# Patient Record
Sex: Female | Born: 1955 | Race: White | Hispanic: No | Marital: Married | Smoking: Current every day smoker
Health system: Southern US, Community
[De-identification: ages and names within clinical notes are randomized; demographics above are authoritative.]

## PROBLEM LIST (undated history)

## (undated) DIAGNOSIS — K219 Gastro-esophageal reflux disease without esophagitis: Secondary | ICD-10-CM

## (undated) DIAGNOSIS — J381 Polyp of vocal cord and larynx: Secondary | ICD-10-CM

## (undated) DIAGNOSIS — M810 Age-related osteoporosis without current pathological fracture: Secondary | ICD-10-CM

## (undated) DIAGNOSIS — C539 Malignant neoplasm of cervix uteri, unspecified: Secondary | ICD-10-CM

## (undated) HISTORY — DX: Gastro-esophageal reflux disease without esophagitis: K21.9

## (undated) HISTORY — DX: Age-related osteoporosis without current pathological fracture: M81.0

## (undated) HISTORY — PX: MOUTH SURGERY: SHX715

## (undated) HISTORY — DX: Malignant neoplasm of cervix uteri, unspecified: C53.9

## (undated) HISTORY — PX: ABDOMINAL HYSTERECTOMY: SHX81

## (undated) HISTORY — DX: Polyp of vocal cord and larynx: J38.1

---

## 1999-06-05 ENCOUNTER — Encounter: Payer: Self-pay | Admitting: Family Medicine

## 1999-06-05 ENCOUNTER — Ambulatory Visit (HOSPITAL_COMMUNITY): Admission: RE | Admit: 1999-06-05 | Discharge: 1999-06-05 | Payer: Self-pay | Admitting: Family Medicine

## 1999-06-18 ENCOUNTER — Encounter: Payer: Self-pay | Admitting: Family Medicine

## 1999-06-18 ENCOUNTER — Ambulatory Visit (HOSPITAL_COMMUNITY): Admission: RE | Admit: 1999-06-18 | Discharge: 1999-06-18 | Payer: Self-pay | Admitting: Family Medicine

## 1999-07-09 ENCOUNTER — Ambulatory Visit (HOSPITAL_COMMUNITY): Admission: RE | Admit: 1999-07-09 | Discharge: 1999-07-09 | Payer: Self-pay | Admitting: Gastroenterology

## 1999-07-09 ENCOUNTER — Encounter (INDEPENDENT_AMBULATORY_CARE_PROVIDER_SITE_OTHER): Payer: Self-pay

## 1999-08-07 ENCOUNTER — Ambulatory Visit (HOSPITAL_COMMUNITY): Admission: RE | Admit: 1999-08-07 | Discharge: 1999-08-07 | Payer: Self-pay | Admitting: Gastroenterology

## 1999-08-07 ENCOUNTER — Encounter: Payer: Self-pay | Admitting: Gastroenterology

## 2000-07-29 ENCOUNTER — Ambulatory Visit (HOSPITAL_COMMUNITY): Admission: RE | Admit: 2000-07-29 | Discharge: 2000-07-29 | Payer: Self-pay | Admitting: Family Medicine

## 2000-07-29 ENCOUNTER — Encounter: Payer: Self-pay | Admitting: Family Medicine

## 2000-12-03 ENCOUNTER — Encounter: Payer: Self-pay | Admitting: Family Medicine

## 2000-12-03 ENCOUNTER — Ambulatory Visit (HOSPITAL_COMMUNITY): Admission: RE | Admit: 2000-12-03 | Discharge: 2000-12-03 | Payer: Self-pay | Admitting: Family Medicine

## 2002-06-27 ENCOUNTER — Encounter: Payer: Self-pay | Admitting: Family Medicine

## 2002-06-27 ENCOUNTER — Ambulatory Visit (HOSPITAL_COMMUNITY): Admission: RE | Admit: 2002-06-27 | Discharge: 2002-06-27 | Payer: Self-pay | Admitting: Family Medicine

## 2002-07-14 ENCOUNTER — Ambulatory Visit (HOSPITAL_COMMUNITY): Admission: RE | Admit: 2002-07-14 | Discharge: 2002-07-14 | Payer: Self-pay | Admitting: Family Medicine

## 2002-07-14 ENCOUNTER — Encounter: Payer: Self-pay | Admitting: Family Medicine

## 2004-12-27 ENCOUNTER — Ambulatory Visit (HOSPITAL_COMMUNITY): Admission: RE | Admit: 2004-12-27 | Discharge: 2004-12-27 | Payer: Self-pay | Admitting: Family Medicine

## 2005-10-15 HISTORY — PX: COLONOSCOPY: SHX174

## 2005-11-03 ENCOUNTER — Ambulatory Visit (HOSPITAL_COMMUNITY): Admission: RE | Admit: 2005-11-03 | Discharge: 2005-11-03 | Payer: Self-pay | Admitting: Gastroenterology

## 2005-11-03 ENCOUNTER — Encounter (INDEPENDENT_AMBULATORY_CARE_PROVIDER_SITE_OTHER): Payer: Self-pay | Admitting: *Deleted

## 2005-11-03 ENCOUNTER — Ambulatory Visit: Payer: Self-pay | Admitting: Gastroenterology

## 2007-02-05 ENCOUNTER — Ambulatory Visit (HOSPITAL_COMMUNITY): Admission: RE | Admit: 2007-02-05 | Discharge: 2007-02-05 | Payer: Self-pay | Admitting: Family Medicine

## 2007-02-18 ENCOUNTER — Ambulatory Visit (HOSPITAL_COMMUNITY): Admission: RE | Admit: 2007-02-18 | Discharge: 2007-02-18 | Payer: Self-pay | Admitting: Family Medicine

## 2008-09-06 ENCOUNTER — Ambulatory Visit (HOSPITAL_COMMUNITY): Admission: RE | Admit: 2008-09-06 | Discharge: 2008-09-06 | Payer: Self-pay | Admitting: Family Medicine

## 2008-11-16 ENCOUNTER — Encounter (HOSPITAL_COMMUNITY): Admission: RE | Admit: 2008-11-16 | Discharge: 2008-12-13 | Payer: Self-pay | Admitting: Family Medicine

## 2008-11-16 ENCOUNTER — Ambulatory Visit (HOSPITAL_COMMUNITY): Payer: Self-pay | Admitting: Family Medicine

## 2009-09-10 ENCOUNTER — Ambulatory Visit (HOSPITAL_COMMUNITY): Admission: RE | Admit: 2009-09-10 | Discharge: 2009-09-10 | Payer: Self-pay | Admitting: Family Medicine

## 2010-04-07 ENCOUNTER — Encounter: Payer: Self-pay | Admitting: Family Medicine

## 2010-08-02 NOTE — Op Note (Signed)
NAME:  Brittany Blankenship, Brittany Blankenship                ACCOUNT NO.:  [id number]   MEDICAL RECORD NO.:  [id number]          PATIENT TYPE:  AMB   LOCATION:  DAY                           FACILITY:  [facility]   PHYSICIAN:  Kassie Mends, M.D.      DATE OF BIRTH:  1955/09/11   DATE OF PROCEDURE:  11/03/2005  DATE OF DISCHARGE:                                 OPERATIVE REPORT   REFERRING PHYSICIAN:  Scott A. Gerda Diss, M.D.   PROCEDURE:  Colonoscopy with cold forceps polypectomy.   INDICATIONS FOR PROCEDURE:  Brittany Blankenship is a 55 year old female who presents  for average-risk colon cancer screening.   FINDINGS:  1. 4 mm sessile rectal polyp removed via cold forceps.  2. Otherwise no inflammatory changes, vascular ectasias, or diverticula      evident.  3. Otherwise, normal retroflexed view of the rectum.   RECOMMENDATIONS:  1. Follow-up biopsies.  If biopsy reveals an adenomatous polyp, then she      need repeat screening colonoscopy in 5 years.  Her first degree      relatives would need to begin colon cancer screening at age 82.  2. No aspirin, NSAIDS, or anticoagulation for 5 days.  3. Follow up with Scott A. Gerda Diss, M.D.   MEDICATIONS:  1. Demerol 100 mg IV.  2. Versed 4 mg IV.   DESCRIPTION OF PROCEDURE:  Physical examination was performed and informed  consent was obtained from the patient after explaining all the benefits,  risks, and alternatives to the procedure.  The patient was connected to the  monitor and placed in the left lateral position.  Continuous oxygen was  provided by nasal cannula and IV medicine administered through an indwelling  cannula.  After administration of sedation and rectal examination, the scope  was  advanced under direct visualization to the cecum.  The scope was  subsequently removed slowly by carefully examining the color, texture,  anatomy, and integrity of the mucosa on the way out.  The patient was  recovered in the endoscopy suite and discharged to home in  satisfactory  condition.      Kassie Mends, M.D.  Electronically Signed     SM/MEDQ  D:  11/03/2005  T:  11/03/2005  Job:  [id number]   cc:   Lorin Picket A. Gerda Diss, MD  Fax: [phone]

## 2010-10-17 ENCOUNTER — Encounter: Payer: Self-pay | Admitting: Gastroenterology

## 2011-09-08 ENCOUNTER — Ambulatory Visit (HOSPITAL_COMMUNITY)
Admission: RE | Admit: 2011-09-08 | Discharge: 2011-09-08 | Disposition: A | Discharge status: Home / Self Care | Payer: BC Managed Care – PPO | Source: Ambulatory Visit | Attending: Family Medicine | Admitting: Family Medicine

## 2011-09-08 ENCOUNTER — Other Ambulatory Visit: Payer: Self-pay | Admitting: Family Medicine

## 2011-09-08 DIAGNOSIS — S92919A Unspecified fracture of unspecified toe(s), initial encounter for closed fracture: Secondary | ICD-10-CM | POA: Insufficient documentation

## 2011-09-08 DIAGNOSIS — X58XXXA Exposure to other specified factors, initial encounter: Secondary | ICD-10-CM | POA: Insufficient documentation

## 2011-09-08 DIAGNOSIS — M79672 Pain in left foot: Secondary | ICD-10-CM

## 2011-09-08 DIAGNOSIS — M79609 Pain in unspecified limb: Secondary | ICD-10-CM | POA: Insufficient documentation

## 2012-07-24 ENCOUNTER — Encounter: Payer: Self-pay | Admitting: *Deleted

## 2012-07-27 ENCOUNTER — Encounter: Payer: Self-pay | Admitting: Family Medicine

## 2012-07-27 ENCOUNTER — Ambulatory Visit (INDEPENDENT_AMBULATORY_CARE_PROVIDER_SITE_OTHER): Payer: BC Managed Care – PPO | Admitting: Family Medicine

## 2012-07-27 VITALS — BP 142/86 | Temp 98.5°F

## 2012-07-27 DIAGNOSIS — R1013 Epigastric pain: Secondary | ICD-10-CM

## 2012-07-27 DIAGNOSIS — R109 Unspecified abdominal pain: Secondary | ICD-10-CM

## 2012-07-27 MED ORDER — PANTOPRAZOLE SODIUM 40 MG PO TBEC: 40.0000 mg | DELAYED_RELEASE_TABLET | Freq: Every day | ORAL | Status: DC

## 2012-07-27 NOTE — Progress Notes (Signed)
Ct abd/pelvis with contrast scheduled [facility] 5-16 8 am pt notified

## 2012-07-27 NOTE — Patient Instructions (Signed)
It is important to go ahead and start taking your medication. One tablet every day this will reduce the amount of acid in your stomach and should help with the pain. It is okay to use Maalox up to 4 times a day. If you get worse over the next several days please call me. Please do your lab work I will let you know the results. We will also set you up for a CAT scan as well. I will also be working to set you up with Dr. Karilyn Cota.

## 2012-07-27 NOTE — Progress Notes (Signed)
  Subjective:    Patient ID: Brittany Blankenship, female    DOB: 1956/02/05, 57 y.o.   MRN: [id number]  Abdominal Pain The current episode started 1 to 4 weeks ago. The onset quality is gradual. The problem occurs constantly. Associated symptoms include anorexia, belching, flatus and nausea. The pain is aggravated by certain positions. Treatments tried: mylanta. The treatment provided no relief. Her past medical history is significant for GERD.  started off with tenderness, 2 weeks ago constsant. Worse with eating. At first diarrhea now dark bm. No dizziness. No sweats. Worse with laying down. Sleeping in a recliner. Patient relates that the pain is now constant causes such discomfort that it does wake her at night does not radiate down the legs she denies any chest pressure tightness denies reflux symptoms. She states that the pain is consistent but seems to be getting worse. She does smoke. She does not drink. She does not abuse and try an inflammatory his. Past medical history is benign. Family history benign. No weight loss recently. No sweats or chills.   Review of Systems  Gastrointestinal: Positive for nausea, abdominal pain, anorexia and flatus.       Objective:   Physical Exam Vital signs stable. Neck is supple no masses are felt lungs are clear no crackles heart is regular abdomen is with moderate 2 severe tenderness throughout the midabdomen area no guarding but she does have significant tenderness and pain in the epigastric midabdomen and left quadrant areas. This is much more than what should be with gastritis. Extremities no edema       Assessment & Plan:  Significant abdominal pain-this patient needs to go ahead and start on a PPI to cover for gastritis but I am very concerned about the possibility of other problems going on I would recommend CT scan of the abdomen to look at the pancreas as well as to do lab work in addition to this we need to go ahead and refer the patient to  gastroenterology they will more than likely need an EGD await the results of these tests. 25 minutes spent with patient [phone]

## 2012-07-28 LAB — CBC WITH DIFFERENTIAL/PLATELET
Eosinophils Absolute: 0.3 10*3/uL (ref 0.0–0.7)
Eosinophils Relative: 3 % (ref 0–5)
HCT: 39.7 % (ref 36.0–46.0)
Lymphocytes Relative: 42 % (ref 12–46)
Lymphs Abs: 4.1 10*3/uL — ABNORMAL HIGH (ref 0.7–4.0)
MCH: 29.7 pg (ref 26.0–34.0)
MCV: 88 fL (ref 78.0–100.0)
Monocytes Absolute: 0.6 10*3/uL (ref 0.1–1.0)
Monocytes Relative: 6 % (ref 3–12)
Platelets: 371 10*3/uL (ref 150–400)
RBC: 4.51 MIL/uL (ref 3.87–5.11)

## 2012-07-28 LAB — HEPATIC FUNCTION PANEL
ALT: 14 U/L (ref 0–35)
Albumin: 4.3 g/dL (ref 3.5–5.2)
Alkaline Phosphatase: 73 U/L (ref 39–117)
Total Bilirubin: 0.3 mg/dL (ref 0.3–1.2)
Total Protein: 6.6 g/dL (ref 6.0–8.3)

## 2012-07-28 LAB — BASIC METABOLIC PANEL
Chloride: 105 mEq/L (ref 96–112)
Creat: 0.69 mg/dL (ref 0.50–1.10)
Potassium: 4.1 mEq/L (ref 3.5–5.3)

## 2012-07-28 LAB — LIPASE: Lipase: 33 U/L (ref 0–75)

## 2012-07-30 ENCOUNTER — Ambulatory Visit (HOSPITAL_COMMUNITY)
Admission: RE | Admit: 2012-07-30 | Discharge: 2012-07-30 | Disposition: A | Discharge status: Home / Self Care | Payer: Federal, State, Local not specified - PPO | Source: Ambulatory Visit | Attending: Family Medicine | Admitting: Family Medicine

## 2012-07-30 DIAGNOSIS — R110 Nausea: Secondary | ICD-10-CM | POA: Insufficient documentation

## 2012-07-30 DIAGNOSIS — R109 Unspecified abdominal pain: Secondary | ICD-10-CM | POA: Insufficient documentation

## 2012-07-30 MED ORDER — IOHEXOL 300 MG/ML  SOLN
100.0000 mL | Freq: Once | INTRAMUSCULAR | Status: AC | PRN
  Administered 2012-07-30: 100 mL via INTRAVENOUS

## 2012-08-17 ENCOUNTER — Encounter: Payer: Self-pay | Admitting: Family Medicine

## 2012-08-17 ENCOUNTER — Ambulatory Visit (HOSPITAL_COMMUNITY)
Admission: RE | Admit: 2012-08-17 | Discharge: 2012-08-17 | Disposition: A | Discharge status: Home / Self Care | Payer: Federal, State, Local not specified - PPO | Source: Ambulatory Visit | Attending: Family Medicine | Admitting: Family Medicine

## 2012-08-17 ENCOUNTER — Ambulatory Visit (INDEPENDENT_AMBULATORY_CARE_PROVIDER_SITE_OTHER): Payer: BC Managed Care – PPO | Admitting: Family Medicine

## 2012-08-17 VITALS — BP 130/86 | Temp 98.7°F | Ht 66.0 in | Wt 125.1 lb

## 2012-08-17 DIAGNOSIS — M25579 Pain in unspecified ankle and joints of unspecified foot: Secondary | ICD-10-CM | POA: Insufficient documentation

## 2012-08-17 DIAGNOSIS — S99929A Unspecified injury of unspecified foot, initial encounter: Secondary | ICD-10-CM

## 2012-08-17 DIAGNOSIS — S99922A Unspecified injury of left foot, initial encounter: Secondary | ICD-10-CM

## 2012-08-17 DIAGNOSIS — M7989 Other specified soft tissue disorders: Secondary | ICD-10-CM | POA: Insufficient documentation

## 2012-08-17 DIAGNOSIS — M79609 Pain in unspecified limb: Secondary | ICD-10-CM | POA: Insufficient documentation

## 2012-08-17 DIAGNOSIS — S8990XA Unspecified injury of unspecified lower leg, initial encounter: Secondary | ICD-10-CM

## 2012-08-17 NOTE — Patient Instructions (Signed)
Do your xrays

## 2012-08-17 NOTE — Progress Notes (Signed)
  Subjective:    Patient ID: Brittany Blankenship, female    DOB: 01-Nov-1955, 57 y.o.   MRN: [id number]  Ankle Pain  The incident occurred more than 1 week ago. The incident occurred at home. The injury mechanism was a fall. The pain is present in the left foot, left ankle and left leg. The quality of the pain is described as aching. The pain is at a severity of 7/10. The pain is moderate. The pain has been constant since onset. Associated symptoms include an inability to bear weight. She reports no foreign bodies present. The symptoms are aggravated by weight bearing. She has tried non-weight bearing for the symptoms. The treatment provided moderate relief.   She relates that she was stepping down off a ladder and she felt something give in her ankle but she denies a pop it did not swell right away but as the day went on it started swelling causing pain and discomfort became worse over the next several days now she is to the point where she is limping. She is a smoker age 4. Past medical history benign no fevers. Family history noncontributory.   Review of SystemsCalf is nontender knee is normal ankle is swollen foot is swollen with moderate edema     Objective:   Physical Exam Patient with significant swelling in the ankle and footNo cellulitis. Moderate tenderness on palpation in the medial aspect as well as central aspect of the lower leg plus tenderness in the foot.       Assessment & Plan:  Ankle sprain-cannot rule out the possibility of a stress fracture. X-rays of the lower leg as well as foot recommended. Plus also depending on how patient does over the next couple weeks mainly in that needing to have bone scan or MRI. Patient understands this.

## 2013-01-06 ENCOUNTER — Telehealth: Payer: Self-pay | Admitting: Family Medicine

## 2013-01-06 NOTE — Telephone Encounter (Signed)
I certainly support people being able to see their specialist. But let's be clear if this requires a referral in we have not seen them very recently for this issue then [facility] says we see them evaluate them then referred them if indicated.

## 2013-01-06 NOTE — Telephone Encounter (Signed)
Pt needs new referral to Dr. Karilyn Cota for an upper GI series, per pt's husband-previous referral is too old, needs new one.  Please initiate in system so that I may process

## 2013-01-07 NOTE — Telephone Encounter (Signed)
Per Dewayne Hatch - If we havent seen the patient within last 3 years, we have them see their PCP as some issues may not be GI related.   Mr Brittany Blankenship was seen in 2012 so Im not sure why he was told to get a referral. I dont have any info for Brittany Blankenship so she would have been told to see her PCP to be evaluated and if her issues are GI related then her PCP can refer her.   Hope this helps

## 2013-01-07 NOTE — Telephone Encounter (Signed)
Emailed Ann @ Dr. Patty Sermons office for clarification, await response

## 2013-01-13 ENCOUNTER — Encounter: Payer: Self-pay | Admitting: Family Medicine

## 2013-01-13 ENCOUNTER — Ambulatory Visit (INDEPENDENT_AMBULATORY_CARE_PROVIDER_SITE_OTHER): Payer: BC Managed Care – PPO | Admitting: Family Medicine

## 2013-01-13 VITALS — BP 124/82 | Temp 98.6°F | Ht 66.0 in | Wt 131.4 lb

## 2013-01-13 DIAGNOSIS — R109 Unspecified abdominal pain: Secondary | ICD-10-CM

## 2013-01-13 DIAGNOSIS — M25579 Pain in unspecified ankle and joints of unspecified foot: Secondary | ICD-10-CM

## 2013-01-13 DIAGNOSIS — M25571 Pain in right ankle and joints of right foot: Secondary | ICD-10-CM

## 2013-01-13 NOTE — Progress Notes (Signed)
  Subjective:    Patient ID: Brittany Blankenship, female    DOB: 05/30/1955, 57 y.o.   MRN: [id number]  Abdominal Pain This is a recurrent problem. The current episode started more than 1 month ago. The onset quality is undetermined. The problem occurs intermittently. The problem has been unchanged. The pain is located in the generalized abdominal region. The pain is mild. The quality of the pain is aching and dull (numbness). The abdominal pain radiates to the epigastric region. The pain is aggravated by eating. The pain is relieved by nothing. She has tried proton pump inhibitors for the symptoms. The treatment provided no relief.   she also has foot pain has seen a podiatrist they recommended cutting I nerve she would like a second opinion She smokes she knows she needs quit Patient had CT scan of abdomen which was benign but never followed through at doing at EGD.    Review of Systems  Gastrointestinal: Positive for abdominal pain.   right foot pain denies chest tightness pressure    Objective:   Physical Exam  Lungs are clear hearts regular pulse normal moderate epigastric abdominal pain and discomfort. Extremities no edema skin warm foot exam she has tenderness in the lower part of the foot near the second and third metatarsal. She is RE seen a podiatrist who recommended cutting some nerves in her foot she did not want to do that she desires a second opinion      Assessment & Plan:  Foot -orth ref Abd- [facility]- EGD, urgent referral. Hopefully they can EGD in the near future. Warning signs discussed

## 2013-01-14 ENCOUNTER — Telehealth: Payer: Self-pay | Admitting: *Deleted

## 2013-01-14 NOTE — Telephone Encounter (Signed)
Called to tell patient that Dr. Patty Sermons office stated she was Dr. Darrick Penna patient and they would not see her. Patient stated she did not know who Dr. Darrick Penna was and that Dr. Karilyn Cota did her colonoscopy. She stated if [facility] would not see her then she wanted to go to [facility].

## 2013-01-17 ENCOUNTER — Other Ambulatory Visit: Payer: Self-pay | Admitting: Family Medicine

## 2013-01-17 DIAGNOSIS — K219 Gastro-esophageal reflux disease without esophagitis: Secondary | ICD-10-CM

## 2013-01-17 NOTE — Telephone Encounter (Signed)
Referral to GI, put in , our staff will set up appt and then pt will be notified.

## 2013-01-20 ENCOUNTER — Encounter (INDEPENDENT_AMBULATORY_CARE_PROVIDER_SITE_OTHER): Payer: Self-pay | Admitting: *Deleted

## 2013-01-25 ENCOUNTER — Ambulatory Visit (INDEPENDENT_AMBULATORY_CARE_PROVIDER_SITE_OTHER): Payer: Federal, State, Local not specified - PPO | Admitting: Internal Medicine

## 2013-01-25 ENCOUNTER — Encounter (INDEPENDENT_AMBULATORY_CARE_PROVIDER_SITE_OTHER): Payer: Self-pay | Admitting: *Deleted

## 2013-01-25 ENCOUNTER — Encounter (INDEPENDENT_AMBULATORY_CARE_PROVIDER_SITE_OTHER): Payer: Self-pay | Admitting: Internal Medicine

## 2013-01-25 ENCOUNTER — Other Ambulatory Visit (INDEPENDENT_AMBULATORY_CARE_PROVIDER_SITE_OTHER): Payer: Self-pay | Admitting: *Deleted

## 2013-01-25 VITALS — BP 142/76 | HR 72 | Temp 98.6°F | Ht 66.0 in | Wt 130.8 lb

## 2013-01-25 DIAGNOSIS — K219 Gastro-esophageal reflux disease without esophagitis: Secondary | ICD-10-CM

## 2013-01-25 DIAGNOSIS — G8929 Other chronic pain: Secondary | ICD-10-CM

## 2013-01-25 DIAGNOSIS — M810 Age-related osteoporosis without current pathological fracture: Secondary | ICD-10-CM

## 2013-01-25 DIAGNOSIS — R1013 Epigastric pain: Secondary | ICD-10-CM

## 2013-01-25 NOTE — Progress Notes (Signed)
Subjective:     Patient ID: Brittany Blankenship, female   DOB: [birth year], 57 y.o.   MRN: [id number]  HPI Referred to our office by Dr. Gerda Diss for epigastric pain. She has had pain in her epigastric pain for about 6- 7 months. She describes a tenderness. The tenderness is constant.  She saw Dr. Gerda Diss, and was placed on Protonix. The protonix has helped. Before the Protonix she would ball at night and would rest her stomach on a blanket. It hurt to lie on her abdomen.   She was having bouts of diarrhea, but is better with the Protonix. She has a fullness in her epigastric region.  She denies any acid reflux. No dysphagia. Appetite is not good. No weight loss. Foods do not taste good. She has early satiety. She usually has a BM x 2 a day.    Hx of pancreatic cancer: Mother   07/2012 CT abdomen/pelvis with CM;  IMPRESSION:  1. No acute findings or significant abdominal or pelvic pathology  noted.  2. Incidentally noted bilateral L5 pars defects, with severe  degenerative disc disease and grade 1 anterolisthesis at L5-S1.   Colonoscopy 2007 DR. Fields: 1. 4 mm sessile rectal polyp removed via cold forceps.  2. Otherwise no inflammatory changes, vascular ectasias, or diverticula  evident.  3. Otherwise, normal retroflexed view of the rectum. Biopsy:   RECTUM, BIOPSY: HYPERPLASTIC POLYP. NO ADENOMATOUS CHANGE OR MALIGNANCY IDENTIFIED.   Family HX Married One daughter that has a hx of a brain tumor. She works at the [employer] Value Date/Time   WBC 9.7 07/27/2012 1550   RBC 4.51 07/27/2012 1550   HGB 13.4 07/27/2012 1550   HCT 39.7 07/27/2012 1550   PLT 371 07/27/2012 1550   MCV 88.0 07/27/2012 1550   MCH 29.7 07/27/2012 1550   MCHC 33.8 07/27/2012 1550   RDW 13.2 07/27/2012 1550   LYMPHSABS 4.1* 07/27/2012 1550   MONOABS 0.6 07/27/2012 1550   EOSABS 0.3 07/27/2012 1550   BASOSABS 0.1 07/27/2012 1550    Hepatic Function Panel     Component Value Date/Time   PROT  6.6 07/27/2012 1550   ALBUMIN 4.3 07/27/2012 1550   AST 19 07/27/2012 1550   ALT 14 07/27/2012 1550   ALKPHOS 73 07/27/2012 1550   BILITOT 0.3 07/27/2012 1550   BILIDIR <0.1 07/27/2012 1550   IBILI NOT CALC 07/27/2012 1550      Review of Systems see hpi Current Outpatient Prescriptions  Medication Sig Dispense Refill  . pantoprazole (PROTONIX) 40 MG tablet Take 40 mg by mouth daily. PRN       No current facility-administered medications for this visit.   Past Surgical History  Procedure Laterality Date  . Mouth surgery    . Colonoscopy  10/2005    polyps  . Abdominal hysterectomy      partial   Allergies  Allergen Reactions  . Levaquin [Levofloxacin In D5w]     Joint pain  . Voltaren [Diclofenac Sodium]     Edema to face        Objective:   Physical Exam  Filed Vitals:   01/25/13 1057  BP: 142/76  Pulse: 72  Temp: 98.6 F (37 C)  Height: 5\' 6"  (1.676 m)  Weight: 130 lb 12.8 oz (59.33 kg)   Alert and oriented. Skin warm and dry. Oral mucosa is moist.   . Sclera anicteric, conjunctivae is pink. Thyroid not enlarged. No cervical lymphadenopathy. Lungs clear.  Heart regular rate and rhythm.  Abdomen is soft. Bowel sounds are positive. No hepatomegaly. No abdominal masses felt. No tenderness.  No edema to lower extremities.       Assessment:   Epigastric tenderness x 7 months.     Plan:    EGD with Dr Karilyn Cota. The risks and benefits such as perforation, bleeding, and infection were reviewed with the patient and is agreeable.

## 2013-01-25 NOTE — Patient Instructions (Signed)
EGD with Dr. Rehman. 

## 2013-01-26 DIAGNOSIS — R1013 Epigastric pain: Secondary | ICD-10-CM | POA: Insufficient documentation

## 2013-01-26 DIAGNOSIS — K219 Gastro-esophageal reflux disease without esophagitis: Secondary | ICD-10-CM | POA: Insufficient documentation

## 2013-01-26 DIAGNOSIS — M810 Age-related osteoporosis without current pathological fracture: Secondary | ICD-10-CM | POA: Insufficient documentation

## 2013-01-31 ENCOUNTER — Encounter (HOSPITAL_COMMUNITY): Payer: Self-pay | Admitting: Pharmacy Technician

## 2013-02-03 ENCOUNTER — Encounter (HOSPITAL_COMMUNITY)
Admission: RE | Disposition: A | Discharge status: Home / Self Care | Payer: Self-pay | Source: Ambulatory Visit | Attending: Internal Medicine

## 2013-02-03 ENCOUNTER — Ambulatory Visit (HOSPITAL_COMMUNITY)
Admission: RE | Admit: 2013-02-03 | Discharge: 2013-02-03 | Disposition: A | Discharge status: Home / Self Care | Payer: Federal, State, Local not specified - PPO | Source: Ambulatory Visit | Attending: Internal Medicine | Admitting: Internal Medicine

## 2013-02-03 ENCOUNTER — Encounter (HOSPITAL_COMMUNITY): Payer: Self-pay

## 2013-02-03 DIAGNOSIS — R1013 Epigastric pain: Secondary | ICD-10-CM | POA: Insufficient documentation

## 2013-02-03 DIAGNOSIS — K2960 Other gastritis without bleeding: Secondary | ICD-10-CM

## 2013-02-03 DIAGNOSIS — G8929 Other chronic pain: Secondary | ICD-10-CM

## 2013-02-03 HISTORY — PX: ESOPHAGOGASTRODUODENOSCOPY: SHX5428

## 2013-02-03 SURGERY — EGD (ESOPHAGOGASTRODUODENOSCOPY)
Anesthesia: Moderate Sedation

## 2013-02-03 MED ORDER — SUCRALFATE 1 G PO TABS: 1.0000 g | ORAL_TABLET | Freq: Three times a day (TID) | ORAL | Status: DC

## 2013-02-03 MED ORDER — MIDAZOLAM HCL 5 MG/5ML IJ SOLN
INTRAMUSCULAR | Status: AC
  Filled 2013-02-03: qty 10

## 2013-02-03 MED ORDER — MIDAZOLAM HCL 5 MG/5ML IJ SOLN
INTRAMUSCULAR | Status: DC | PRN
  Administered 2013-02-03 (×3): 2 mg via INTRAVENOUS

## 2013-02-03 MED ORDER — MEPERIDINE HCL 50 MG/ML IJ SOLN
INTRAMUSCULAR | Status: DC | PRN
  Administered 2013-02-03 (×2): 25 mg via INTRAVENOUS

## 2013-02-03 MED ORDER — STERILE WATER FOR IRRIGATION IR SOLN
Status: DC | PRN
  Administered 2013-02-03: 16:00:00

## 2013-02-03 MED ORDER — SODIUM CHLORIDE 0.9 % IV SOLN
INTRAVENOUS | Status: DC
  Administered 2013-02-03: 15:00:00 via INTRAVENOUS

## 2013-02-03 MED ORDER — MEPERIDINE HCL 50 MG/ML IJ SOLN
INTRAMUSCULAR | Status: AC
  Filled 2013-02-03: qty 1

## 2013-02-03 NOTE — Op Note (Signed)
EGD PROCEDURE REPORT  PATIENT:  Brittany Blankenship  MR#:  [id number] Birthdate:  07-26-1955, 57 y.o., female Endoscopist:  Dr. Malissa Hippo, MD Referred By:  Dr. Lilyan Punt, MD Procedure Date: 02/03/2013  Procedure:   EGD  Indications:  Patient is 57 year old Caucasian female percent epigastric pain of over 7 months duration and negative workup including CT, CBC and LFTs. She's only partially responded to PPI. She is undergoing diagnostic EGD. Patient does take Aleve but no more than 6-8 doses in any given week.             Informed Consent:  The risks, benefits, alternatives & imponderables which include, but are not limited to, bleeding, infection, perforation, drug reaction and potential missed lesion have been reviewed.  The potential for biopsy, lesion removal, esophageal dilation, etc. have also been discussed.  Questions have been answered.  All parties agreeable.  Please see history & physical in medical record for more information.  Medications:  Demerol 50 mg IV Versed 6 mg IV Cetacaine spray topically for oropharyngeal anesthesia  Description of procedure:  The endoscope was introduced through the mouth and advanced to the second portion of the duodenum without difficulty or limitations. The mucosal surfaces were surveyed very carefully during advancement of the scope and upon withdrawal.  Findings:  Esophagus:  Normal mucosa of the esophagus. GE junction was unremarkable. GEJ:  38 cm Stomach:  Stomach was empty and distended very well with insufflation. Folds in the proximal stomach are normal. Examination mucosa gastric body was normal. There was patchy erythema and edema to antral mucosa with no erosions or ulcers were noted. Pyloric channel was patent. Angularis fundus and cardia were examined by retroflexion of scope and were normal. Duodenum:  Bulbar and postbulbar mucosa was normal.  Therapeutic/Diagnostic Maneuvers Performed:  Random biopsies taken from mucosa antrum  for routine histology.  Complications:  None  Impression: Nonerosive antral gastritis otherwise normal EGD. Gastric biopsy taken for routine histology.  Source of patient's symptoms remain unclear.  Recommendations:  Continue pantoprazole as before. Sucralfate 1 g by mouth a.c. and each bedtime. Patient advised to keep  symptom diary daily for the next two weeks.  Yazmyn Valbuena U  02/03/2013  4:13 PM  CC: Dr. Lilyan Punt, MD & Dr. Bonnetta Barry ref. provider found

## 2013-02-03 NOTE — H&P (Signed)
Brittany Blankenship is an 57 y.o. female.   Chief Complaint: Patient is here for EGD. HPI: Patient is 57 year old Caucasian female who presents with a were seven-month history of intermittent epigastric pain. She had abdominopelvic CT in May 2014 and no abnormality was noted: For symptoms. Her CBC and LFTs have been normal. She is 50% better but pantoprazole has been almost daily. She's not sure if pain gets better or worse with food. She denies nausea vomiting melena or rectal bleeding. Her appetite is fair. She has not lost any weight recently. She is up-to-date on screening for CRC.  Past Medical History  Diagnosis Date  . GERD (gastroesophageal reflux disease)   . Osteoporosis   . Vocal cord polyps     Past Surgical History  Procedure Laterality Date  . Mouth surgery    . Colonoscopy  10/2005    polyps  . Abdominal hysterectomy      partial    Family History  Problem Relation Age of Onset  . Diabetes Brother    Social History:  reports that she has been smoking.  She does not have any smokeless tobacco history on file. She reports that she does not drink alcohol or use illicit drugs.  Allergies:  Allergies  Allergen Reactions  . Levaquin [Levofloxacin In D5w]     Joint pain  . Voltaren [Diclofenac Sodium]     Edema to face    Medications Prior to Admission  Medication Sig Dispense Refill  . pantoprazole (PROTONIX) 40 MG tablet Take 40 mg by mouth daily as needed. Acid reflux        No results found for this or any previous visit (from the past 48 hour(s)). No results found.  ROS  Blood pressure 134/80, pulse 79, temperature 98.4 F (36.9 C), temperature source Oral, resp. rate 19, height 5\' 6"  (1.676 m), weight 130 lb (58.968 kg), SpO2 96.00%. Physical Exam  Constitutional: She is oriented to person, place, and time. She appears well-developed and well-nourished.  HENT:  Mouth/Throat: Oropharynx is clear and moist.  Eyes: Conjunctivae are normal. No scleral icterus.   Neck: No thyromegaly present.  Cardiovascular: Normal rate, regular rhythm and normal heart sounds.   No murmur heard. Respiratory: Effort normal and breath sounds normal.  GI: Soft. She exhibits no mass. Tenderness: mild midepigastric tenderness.  Musculoskeletal: She exhibits no edema.  Lymphadenopathy:    She has no cervical adenopathy.  Neurological: She is alert and oriented to person, place, and time.  Skin: Skin is warm and dry.     Assessment/Plan Chronic epigastric pain partially responsive to PPI. Diagnostic EGD.  Mindy Gali U 02/03/2013, 3:40 PM

## 2013-02-09 ENCOUNTER — Encounter (HOSPITAL_COMMUNITY): Payer: Self-pay | Admitting: Internal Medicine

## 2013-02-16 NOTE — Progress Notes (Signed)
LM with husband for patient to return the call.  

## 2013-02-16 NOTE — Progress Notes (Signed)
Apt has been scheduled for 05/16/12 with Dr. Karilyn Cota

## 2013-04-06 ENCOUNTER — Ambulatory Visit (INDEPENDENT_AMBULATORY_CARE_PROVIDER_SITE_OTHER): Payer: BC Managed Care – PPO | Admitting: Family Medicine

## 2013-04-06 ENCOUNTER — Encounter: Payer: Self-pay | Admitting: Family Medicine

## 2013-04-06 ENCOUNTER — Telehealth: Payer: Self-pay | Admitting: *Deleted

## 2013-04-06 VITALS — BP 148/92 | Temp 98.3°F | Ht 66.0 in | Wt 133.4 lb

## 2013-04-06 DIAGNOSIS — L2089 Other atopic dermatitis: Secondary | ICD-10-CM

## 2013-04-06 DIAGNOSIS — R109 Unspecified abdominal pain: Secondary | ICD-10-CM

## 2013-04-06 DIAGNOSIS — L209 Atopic dermatitis, unspecified: Secondary | ICD-10-CM

## 2013-04-06 MED ORDER — MOMETASONE FUROATE 0.1 % EX CREA: 1.0000 "application " | TOPICAL_CREAM | Freq: Two times a day (BID) | CUTANEOUS | Status: DC

## 2013-04-06 MED ORDER — PANTOPRAZOLE SODIUM 40 MG PO TBEC: 40.0000 mg | DELAYED_RELEASE_TABLET | Freq: Two times a day (BID) | ORAL | Status: DC

## 2013-04-06 MED ORDER — SUCRALFATE 1 G PO TABS: 1.0000 g | ORAL_TABLET | Freq: Three times a day (TID) | ORAL | Status: DC

## 2013-04-06 NOTE — Telephone Encounter (Signed)
Left message with nuclear med at [facility]. Need to schedule pt HIDA scan. Order is in. Pt cannot do Mondays. She wants early morning appt. Please call pt at [phone] when appt info.

## 2013-04-06 NOTE — Progress Notes (Signed)
   Subjective:    Patient ID: Brittany Blankenship, female    DOB: 1955/12/21, 58 y.o.   MRN: [id number]  HPI Patient is here today for abdominal pain and rash.  The abdominal pain is on going. This is nothing new. Please see below. She relates the abdominal discomfort or burning discomfort in the midabdomen. Epigastric married. Does not seem to have very with eating. Medications really have not helped any. He's had ongoing diarrhea that started well before she started having abdominal from she saw the gastroenterologist they did testing all of these notes were reviewed.  The rash is new. It is on her back. She first noticed it a week ago. It is itchy as well. She's not had this happen anywhere else it's not happened on her chest or her abdomen just on her back. Itches intensely.  She denies fever.    Review of Systems Patient relates epigastric pain discomfort relates slight nausea denies any vomiting. States the pain is there throughout the day does not wake her up at night had EGD   no cough no weight loss appetite good energy level fair. Objective:   Physical Exam  Lungs are clear hearts regular abdomen soft no guarding rebound or tenderness All of her gastroenterology records were reviewed in detail including pathology EGD notes and office notes     Assessment & Plan:  #1 rash-possible atopic dermatitis I find no evidence of any type of shingles. Elocon cream as directed twice daily.  #2 gastritis-increased Scientist, research (medical) twice daily. Continue Carafate. We will check some lab work because of the diarrhea to rule out celiac and check a CBC to make sure she does not get anemic also patient needs HIDA test. Await findings of this. Followup again in 3-4 weeks

## 2013-04-07 LAB — CBC WITH DIFFERENTIAL/PLATELET
Basophils Absolute: 0.1 10*3/uL (ref 0.0–0.1)
Basophils Relative: 1 % (ref 0–1)
EOS ABS: 0.3 10*3/uL (ref 0.0–0.7)
EOS PCT: 3 % (ref 0–5)
HCT: 40.2 % (ref 36.0–46.0)
Hemoglobin: 13.8 g/dL (ref 12.0–15.0)
LYMPHS ABS: 4.1 10*3/uL — AB (ref 0.7–4.0)
LYMPHS PCT: 41 % (ref 12–46)
MCH: 30.5 pg (ref 26.0–34.0)
MCHC: 34.3 g/dL (ref 30.0–36.0)
MCV: 88.7 fL (ref 78.0–100.0)
MONOS PCT: 6 % (ref 3–12)
Monocytes Absolute: 0.6 10*3/uL (ref 0.1–1.0)
Neutro Abs: 4.9 10*3/uL (ref 1.7–7.7)
Neutrophils Relative %: 49 % (ref 43–77)
Platelets: 315 10*3/uL (ref 150–400)
RBC: 4.53 MIL/uL (ref 3.87–5.11)
RDW: 12.9 % (ref 11.5–15.5)
WBC: 10 10*3/uL (ref 4.0–10.5)

## 2013-04-07 LAB — LIPASE: Lipase: 40 U/L (ref 0–75)

## 2013-04-07 LAB — TISSUE TRANSGLUTAMINASE, IGA: Tissue Transglutaminase Ab, IgA: 3.4 U/mL (ref <20)

## 2013-04-07 NOTE — Telephone Encounter (Signed)
Left message to return call 

## 2013-04-07 NOTE — Telephone Encounter (Signed)
Notified patient HIDA Scan scheduled at [facility] Tues 04/12/13 at 8 am- be there at 7:45am to register- NPO and No pain meds after midnight. Patient verbalized understanding.

## 2013-04-07 NOTE — Telephone Encounter (Signed)
HIDA Scan scheduled at [facility] Tues 04/12/13 at 8 am- be there at 7:45am to register- NPO and No pain meds after midnight.

## 2013-04-08 ENCOUNTER — Encounter: Payer: Self-pay | Admitting: Family Medicine

## 2013-04-12 ENCOUNTER — Encounter (HOSPITAL_COMMUNITY)
Admission: RE | Admit: 2013-04-12 | Discharge: 2013-04-12 | Disposition: A | Discharge status: Home / Self Care | Payer: Federal, State, Local not specified - PPO | Source: Ambulatory Visit | Attending: Family Medicine | Admitting: Family Medicine

## 2013-04-12 ENCOUNTER — Encounter (HOSPITAL_COMMUNITY): Payer: Self-pay

## 2013-04-12 DIAGNOSIS — R109 Unspecified abdominal pain: Secondary | ICD-10-CM | POA: Insufficient documentation

## 2013-04-12 MED ORDER — SINCALIDE 5 MCG IJ SOLR
INTRAMUSCULAR | Status: AC
  Filled 2013-04-12: qty 5

## 2013-04-12 MED ORDER — STERILE WATER FOR INJECTION IJ SOLN
INTRAMUSCULAR | Status: AC
Start: 2013-04-12 — End: 2013-04-12
  Filled 2013-04-12: qty 10

## 2013-04-12 MED ORDER — TECHNETIUM TC 99M MEBROFENIN IV KIT
5.0000 | PACK | Freq: Once | INTRAVENOUS | Status: AC | PRN
  Administered 2013-04-12: 5 via INTRAVENOUS

## 2013-04-29 ENCOUNTER — Telehealth: Payer: Self-pay | Admitting: Family Medicine

## 2013-04-29 NOTE — Telephone Encounter (Signed)
Rx prior auth obtained for pt's PANTOPRAZOLE 40mg  BID, auth dates 02/26/13-04/29/14, faxed approval to [facility]

## 2013-05-04 ENCOUNTER — Encounter: Payer: Self-pay | Admitting: Family Medicine

## 2013-05-04 ENCOUNTER — Ambulatory Visit (INDEPENDENT_AMBULATORY_CARE_PROVIDER_SITE_OTHER): Payer: BC Managed Care – PPO | Admitting: Family Medicine

## 2013-05-04 VITALS — BP 120/90 | Ht 66.0 in | Wt 134.1 lb

## 2013-05-04 DIAGNOSIS — R1013 Epigastric pain: Secondary | ICD-10-CM

## 2013-05-04 NOTE — Progress Notes (Signed)
   Subjective:    Patient ID: Brittany Blankenship, female    DOB: Jul 21, 1955, 59 y.o.   MRN: [id number]  HPI Patient is here for follow up visit for abdominal pain. Currently taking protonix and carafate for her symptoms. Has given her mild relief.  This patient is taking Protonix twice daily. Also using Carafate 2-4 times a day. She still smokes. She uses Aleve intermittently. She states she is trying eat healthier. She denies vomiting relates chronic epigastric pain no weight loss does not wake her up at night no rectal bleeding. Patient has had an endoscopy. She is up-to-date on colonoscopy. In addition to this she had a CT scan last May that did not show any tumors. Patient denies pelvic discomfort. She has had a hysterectomy but still has her ovaries. She also relates she still smokes and she knows she needs to quit. In addition to this patient had gallbladder function test which showed 40% function which is considered normal  Review of Systems    see above. Objective:   Physical Exam Lungs are clear hearts regular neck no masses abdomen is soft mild epigastric pain and discomfort  Weight is stable  It is felt that this epigastric pain is more than likely gastritis/dyspepsia. Is not felt that this patient is having any type of ovarian issue     Assessment & Plan:  Epigastric pain discomfort-continue current medications. He'll limiting all anti-inflammatories. Try to quit smoking. Keep followup appointment with Dr. Laural Golden in early April. If nighttime awakenings rectal bleeding or progressive symptoms notify us. I do not feel the patient needs repeat CT scan. Recent blood work from January was reviewed I do not feel further blood work necessary currently

## 2013-05-16 ENCOUNTER — Ambulatory Visit (INDEPENDENT_AMBULATORY_CARE_PROVIDER_SITE_OTHER): Payer: Federal, State, Local not specified - PPO | Admitting: Internal Medicine

## 2013-06-14 ENCOUNTER — Encounter: Payer: Self-pay | Admitting: Family Medicine

## 2013-06-14 ENCOUNTER — Ambulatory Visit (INDEPENDENT_AMBULATORY_CARE_PROVIDER_SITE_OTHER): Payer: BC Managed Care – PPO | Admitting: Family Medicine

## 2013-06-14 VITALS — BP 120/82 | Temp 98.3°F | Ht 66.0 in | Wt 132.4 lb

## 2013-06-14 DIAGNOSIS — J329 Chronic sinusitis, unspecified: Secondary | ICD-10-CM

## 2013-06-14 DIAGNOSIS — J310 Chronic rhinitis: Secondary | ICD-10-CM

## 2013-06-14 MED ORDER — CEFDINIR 300 MG PO CAPS: 300.0000 mg | ORAL_CAPSULE | Freq: Two times a day (BID) | ORAL | Status: DC

## 2013-06-14 NOTE — Progress Notes (Signed)
   Subjective:    Patient ID: Brittany Blankenship, female    DOB: September 06, 1955, 58 y.o.   MRN: [id number]  Sinusitis This is a new problem. The current episode started 1 to 4 weeks ago. The problem is unchanged. There has been no fever. The pain is moderate. Associated symptoms include congestion, coughing, headaches and sinus pressure. Past treatments include oral decongestants. The treatment provided no relief.  Patient has no other concerns at this time.   Started ten d ago  Started with bad sore throa and coughing  Later pressure in the face and teeth  Moved into the chest  Yell  Phlegm   no  Wheezes,  Review of Systems  HENT: Positive for congestion and sinus pressure.   Respiratory: Positive for cough.   Neurological: Positive for headaches.       Objective:   Physical Exam Alert vitals reviewed moderate nasal frontal tenderness pharynx normal neck supple lungs bronchial cough heart regular rate and rhythm.       Assessment & Plan:  Impression rhinosinusitis with bronchitis plan antibiotics prescribed. Symptomatic care discussed. Warning signs discussed. Encouraged to stop smoking. WSL

## 2013-06-20 ENCOUNTER — Encounter (INDEPENDENT_AMBULATORY_CARE_PROVIDER_SITE_OTHER): Payer: Self-pay | Admitting: Internal Medicine

## 2013-06-20 ENCOUNTER — Ambulatory Visit (INDEPENDENT_AMBULATORY_CARE_PROVIDER_SITE_OTHER): Payer: Federal, State, Local not specified - PPO | Admitting: Internal Medicine

## 2013-06-20 VITALS — BP 130/90 | HR 80 | Temp 98.3°F | Resp 18 | Ht 66.0 in | Wt 131.7 lb

## 2013-06-20 DIAGNOSIS — R1013 Epigastric pain: Secondary | ICD-10-CM

## 2013-06-20 MED ORDER — SUCRALFATE 1 G PO TABS: 2.0000 g | ORAL_TABLET | Freq: Every day | ORAL | Status: DC

## 2013-06-20 MED ORDER — DICYCLOMINE HCL 10 MG PO CAPS: 10.0000 mg | ORAL_CAPSULE | Freq: Three times a day (TID) | ORAL | Status: DC

## 2013-06-20 NOTE — Progress Notes (Signed)
Presenting complaint;  Persistent epigastric pain.  Database;  Patient is 58 year old Caucasian female who began to experience epigastric pain about 11 months ago. She was evaluated by Dr. Sallee Lange in place and pantoprazole. She was also advised to keep Aleve use to minimum. She had normal CBC, LFTs, metabolic 7 and serum amylase in May 2014. She did not improve with pantoprazole and therefore underwent abdominal pelvic CT on 07/30/2012 no abnormality noted to account for symptoms. She was seen in her office on 01/25/2013 and following that visit she underwent esophagogastroduodenoscopy which revealed nonerosive antral gastritis. She was begun on sucralfate. Gastric biopsy reveals normal-appearing mucosa and H. pylori stains were negative. She thought she was getting better with sucralfate and pantoprazole but symptoms have regressed again. She was seen by Dr. Nicki Reaper looking on 04/06/2013 when she was also complaining of diarrhea. She had CBC, celiac panel and serum lipase and these were normal. She then had a HIDA scan with [facility]. EF was 40% which is within normal limits.  Subjective:  Patient continues to complain of daily epigastric pain which he describes as soreness and burning it is not as intense as it used to be but noticeable every morning when she wakes up. She cannot pinpoint any triggers or anything that eases his pain. She says she is trying to improve her eating habits. She states most of her lunches are at [employer]. She denies nausea or heartburn. She does not drink alcohol. She is still smoking but doing her best to quit. She is now smoking half a pack per day. She takes Aleve occasionally. She denies melena or rectal bleeding. She has not lost any weight since her last visit of 01/25/2013. She was seen by Dr. Richardson Landry looking on 06/14/2013 for symptoms of bronchitis and begun on Cefdinir. She feels she is responding to antibiotic therapy.   Current  Medications: Outpatient Encounter Prescriptions as of 06/20/2013  Medication Sig  . Calcium-Magnesium-Vitamin D (ONE-A-DAY CALCIUM PLUS PO) Take by mouth daily.  . cefdinir (OMNICEF) 300 MG capsule Take 1 capsule (300 mg total) by mouth 2 (two) times daily.  . pantoprazole (PROTONIX) 40 MG tablet Take 1 tablet (40 mg total) by mouth 2 (two) times daily. Acid reflux  . sucralfate (CARAFATE) 1 G tablet Take 1 tablet (1 g total) by mouth 4 (four) times daily -  with meals and at bedtime.  . [DISCONTINUED] mometasone (ELOCON) 0.1 % cream Apply 1 application topically 2 (two) times daily.     Objective: Blood pressure 130/90, pulse 80, temperature 98.3 F (36.8 C), temperature source Oral, resp. rate 18, height 5\' 6"  (1.676 m), weight 131 lb 11.2 oz (59.739 kg). Patient is alert and in no acute distress. Conjunctiva is pink. Sclera is nonicteric Oropharyngeal mucosa is normal. No neck masses or thyromegaly noted. Cardiac exam with regular rhythm normal S1 and S2. No murmur or gallop noted. Lungs are clear to auscultation. Abdomen is symmetrical. Bowel sounds are hyperactive. Abdomen is soft with mild/moderate midepigastric tenderness without guarding or rebound. No organomegaly or masses.  No LE edema or clubbing noted.  Labs/studies Results: CBC, serum lipase and celiac antibody panel from 04/06/2013; these are normal.   Assessment:  #1. Epigastric pain of 11 months duration without definite etiology. She's had normal CBC, LFTs, lipase, celiac antibody panel, abdominal pelvic CT, unremarkable EGD and normal HIDA scan. She has not responded to combination of PPI and sucralfate. She can be considered having dyspepsia since most of the common causes of  epigastric pain have been ruled out. While she has not had recent ultrasound but her symptoms are not typical of cholelithiasis. If pain persists she will need endoscopic ultrasound.   Plan: Patient counseled regarding need for her to quit  cigarette smoking. Decrease sucralfate to 2 g by mouth each bedtime. Continue pantoprazole at 40 mg by mouth every morning. Dicyclomine 10 mg by mouth 3 times a day before each meal. Patient informed of potential side effects and if she has any she will stopmedication and call. Patient will call with progress report in 2 weeks. Unless she responds to dicyclomine we'll consider EUS.

## 2013-06-20 NOTE — Patient Instructions (Signed)
Please call office with progress report in 2 weeks.  

## 2013-08-24 ENCOUNTER — Encounter: Payer: Self-pay | Admitting: Nurse Practitioner

## 2013-08-24 ENCOUNTER — Ambulatory Visit (INDEPENDENT_AMBULATORY_CARE_PROVIDER_SITE_OTHER): Payer: BC Managed Care – PPO | Admitting: Nurse Practitioner

## 2013-08-24 VITALS — BP 138/94 | Temp 98.3°F | Ht 66.0 in | Wt 132.0 lb

## 2013-08-24 DIAGNOSIS — L0390 Cellulitis, unspecified: Principal | ICD-10-CM

## 2013-08-24 DIAGNOSIS — L0291 Cutaneous abscess, unspecified: Secondary | ICD-10-CM

## 2013-08-24 MED ORDER — SULFAMETHOXAZOLE-TMP DS 800-160 MG PO TABS: 1.0000 | ORAL_TABLET | Freq: Two times a day (BID) | ORAL | Status: DC

## 2013-08-24 NOTE — Patient Instructions (Signed)
Icy hot smart relief Ice or Counselling psychologist therapy

## 2013-08-25 ENCOUNTER — Encounter: Payer: Self-pay | Admitting: Nurse Practitioner

## 2013-08-25 NOTE — Progress Notes (Signed)
Subjective:  Presents for c/o a "boil" in her private area that began about a week ago. Has been applying warm compresses. No fever. Began draining bloody fluid about 2 days ago. A new boil has come up near the first one.  Objective:   BP 138/94  Temp(Src) 98.3 F (36.8 C)  Ht 5\' 6"  (1.676 m)  Wt 132 lb (59.875 kg)  BMI 21.32 kg/m2 NAD. Alert, oriented. Firm tender nodule with open draining area noted in the mid mons pubis. Tender to palpation. Slightly pink. Small amount of bloody drainage obtained for culture. Area approx. 1 cm in diameter. A smaller tender nodule noted near the top of the right labia.  Assessment: Cellulitis and abscess - Plan: Wound culture  Plan:  Meds ordered this encounter  Medications  . sulfamethoxazole-trimethoprim (BACTRIM DS) 800-160 MG per tablet    Sig: Take 1 tablet by mouth 2 (two) times daily.    Dispense:  20 tablet    Refill:  0    Order Specific Question:  Supervising Provider    Answer:  Mikey Kirschner [[id number]]   Continue warm compresses. Warning signs reviewed. Culture pending. Call back if worsens or persists.

## 2013-08-27 LAB — WOUND CULTURE
Gram Stain: NONE SEEN
Gram Stain: NONE SEEN
Gram Stain: NONE SEEN

## 2013-09-09 ENCOUNTER — Telehealth: Payer: Self-pay | Admitting: Family Medicine

## 2013-09-09 ENCOUNTER — Other Ambulatory Visit: Payer: Self-pay | Admitting: Nurse Practitioner

## 2013-09-09 MED ORDER — SULFAMETHOXAZOLE-TMP DS 800-160 MG PO TABS: 1.0000 | ORAL_TABLET | Freq: Two times a day (BID) | ORAL | Status: DC

## 2013-09-09 NOTE — Telephone Encounter (Signed)
Patient said she is still having the same symptoms that she was seen for on 08/24/13, but a little better. She said she would like another antibiotic called in.  Brittany Blankenship

## 2013-09-09 NOTE — Telephone Encounter (Signed)
As we discussed, MRSA can be difficult to clear up; may take 10-14 days of antibiotic. Also, may recur. Will call in refill of Bactrim

## 2013-09-09 NOTE — Telephone Encounter (Signed)
Patient notified and verbalized understanding. 

## 2013-09-09 NOTE — Telephone Encounter (Signed)
Call back next week if not significantly better. The problem is the infection should respond to levaquin but she cannot take this.

## 2013-12-19 ENCOUNTER — Ambulatory Visit (INDEPENDENT_AMBULATORY_CARE_PROVIDER_SITE_OTHER): Payer: Federal, State, Local not specified - PPO | Admitting: Internal Medicine

## 2014-01-03 ENCOUNTER — Encounter (INDEPENDENT_AMBULATORY_CARE_PROVIDER_SITE_OTHER): Payer: Self-pay | Admitting: *Deleted

## 2014-01-03 ENCOUNTER — Telehealth (INDEPENDENT_AMBULATORY_CARE_PROVIDER_SITE_OTHER): Payer: Self-pay | Admitting: *Deleted

## 2014-01-03 NOTE — Telephone Encounter (Signed)
Brittany Blankenship NO SHOWED for her apt with Dr. Laural Golden on 12/19/13. A NS letter has been mailed.

## 2014-01-04 ENCOUNTER — Ambulatory Visit (INDEPENDENT_AMBULATORY_CARE_PROVIDER_SITE_OTHER): Payer: BC Managed Care – PPO | Admitting: Nurse Practitioner

## 2014-01-04 ENCOUNTER — Encounter: Payer: Self-pay | Admitting: Nurse Practitioner

## 2014-01-04 VITALS — BP 160/90 | Temp 98.4°F | Ht 66.0 in | Wt 135.0 lb

## 2014-01-04 DIAGNOSIS — J0110 Acute frontal sinusitis, unspecified: Secondary | ICD-10-CM

## 2014-01-04 MED ORDER — AMOXICILLIN-POT CLAVULANATE 875-125 MG PO TABS: 1.0000 | ORAL_TABLET | Freq: Two times a day (BID) | ORAL | Status: DC

## 2014-01-09 ENCOUNTER — Encounter: Payer: Self-pay | Admitting: Nurse Practitioner

## 2014-01-09 NOTE — Progress Notes (Signed)
Subjective:  Presents for c/o sinus symptoms for the past 4 days. No fever. Headache behind eyes. Cough producing yellow to brown sputum. Runny nose. Sore throat. Ear pain.   Objective:   BP 160/90  Temp(Src) 98.4 F (36.9 C) (Oral)  Ht 5\' 6"  (1.676 m)  Wt 135 lb (61.236 kg)  BMI 21.80 kg/m2 NAD. Alert, oriented. TMs clear effusion. Pharynx injected with PND noted. Neck supple with mild soft non tender adenopathy. Lungs clear. Heart RRR.  Assessment: Acute frontal sinusitis, recurrence not specified  Elevated blood pressure (above baseline)  Plan:  Meds ordered this encounter  Medications  . pantoprazole (PROTONIX) 40 MG tablet    Sig: Take 40 mg by mouth 2 (two) times daily as needed. Acid reflux  . sucralfate (CARAFATE) 1 G tablet    Sig: Take 2 g by mouth 3 times/day as needed-between meals & bedtime.  Marland Kitchen amoxicillin-clavulanate (AUGMENTIN) 875-125 MG per tablet    Sig: Take 1 tablet by mouth 2 (two) times daily.    Dispense:  20 tablet    Refill:  0    Order Specific Question:  Supervising Provider    Answer:  Mikey Kirschner [[id number]]   Use decongestants sparingly. OTC meds as directed. Has cut back on her smoking. Call back if worsens or persists.

## 2014-05-31 ENCOUNTER — Encounter: Payer: Self-pay | Admitting: Family Medicine

## 2014-05-31 ENCOUNTER — Ambulatory Visit (HOSPITAL_COMMUNITY)
Admission: RE | Admit: 2014-05-31 | Discharge: 2014-05-31 | Disposition: A | Discharge status: Home / Self Care | Payer: Federal, State, Local not specified - PPO | Source: Ambulatory Visit | Attending: Family Medicine | Admitting: Family Medicine

## 2014-05-31 ENCOUNTER — Ambulatory Visit (INDEPENDENT_AMBULATORY_CARE_PROVIDER_SITE_OTHER): Payer: Federal, State, Local not specified - PPO | Admitting: Family Medicine

## 2014-05-31 VITALS — BP 130/82 | Temp 98.6°F | Ht 66.0 in | Wt 130.0 lb

## 2014-05-31 DIAGNOSIS — R197 Diarrhea, unspecified: Secondary | ICD-10-CM

## 2014-05-31 DIAGNOSIS — M79671 Pain in right foot: Secondary | ICD-10-CM

## 2014-05-31 DIAGNOSIS — M19071 Primary osteoarthritis, right ankle and foot: Secondary | ICD-10-CM | POA: Diagnosis not present

## 2014-05-31 DIAGNOSIS — E785 Hyperlipidemia, unspecified: Secondary | ICD-10-CM

## 2014-05-31 DIAGNOSIS — G8929 Other chronic pain: Secondary | ICD-10-CM | POA: Insufficient documentation

## 2014-05-31 DIAGNOSIS — M2550 Pain in unspecified joint: Secondary | ICD-10-CM | POA: Diagnosis not present

## 2014-05-31 NOTE — Progress Notes (Signed)
   Subjective:    Patient ID: Brittany Blankenship, female    DOB: 03-16-1956, 59 y.o.   MRN: [id number]  HPI  Patient arrives with complaint of stuffy nose and muscle aches. Muscle aches have been going on for a while. Patient is smoker Patient relates a lot of joint pain some of the hands some stiffness in the morning also relates foot pain and discomfort in the midfoot with difficulty flexing her toes. Has seen podiatrist and orthopedist one of the recommended surgery that scared her she also states she saw that Rosanne Gutting in for her dog and was told that the dog and parasites the distal sample on the patient and told her she had parasites and she is concerned about Review of Systems No fever no vomiting no diarrhea no mucousy stools denies abdominal pain denies weight loss denies sweats relates foot pain relates intermittent stiffness all over worse in the morning hours    Objective:   Physical Exam On examination the lungs are clear hearts regular pulses normal subjective discomfort under her foot she has difficult time bending her toes because of the discomfort in the forefoot area there is no swelling. Her hands revealed osteoarthritis but not severe.       Assessment & Plan:  1. Right foot pain Next step is to go ahead with an x-ray. If there is destructive arthritis then may need further rheumatologic input. As for lab work it can help screen for any type of destructive arthritis - DG Foot Complete Right  2. Hyperlipidemia She does need basic lab work regarding cholesterol - Lipid panel - Basic metabolic panel  3. Arthralgia See dictation above - Sedimentation rate - Antinuclear Antib (ANA) - Rheumatoid Factor  4. Diarrhea Patient relates that her dad is been treated for parasites and her that [facility] Kidney did a stool sample on the patient and told her that she also had parasites I recommend repeating this. - Ova and parasite examination  To do labs and evaluation before ordering any  additional referrals or medications

## 2014-06-12 ENCOUNTER — Encounter: Payer: Self-pay | Admitting: Family Medicine

## 2014-06-12 LAB — BASIC METABOLIC PANEL
BUN/Creatinine Ratio: 14 (ref 9–23)
BUN: 11 mg/dL (ref 6–24)
CO2: 24 mmol/L (ref 18–29)
Calcium: 9.5 mg/dL (ref 8.7–10.2)
Chloride: 101 mmol/L (ref 97–108)
Creatinine, Ser: 0.78 mg/dL (ref 0.57–1.00)
GFR calc Af Amer: 96 mL/min/{1.73_m2} (ref >59)
GFR, EST NON AFRICAN AMERICAN: 83 mL/min/{1.73_m2} (ref >59)
Glucose: 102 mg/dL — ABNORMAL HIGH (ref 65–99)
Potassium: 4.5 mmol/L (ref 3.5–5.2)
SODIUM: 143 mmol/L (ref 134–144)

## 2014-06-12 LAB — SEDIMENTATION RATE: SED RATE: 3 mm/h (ref 0–40)

## 2014-06-12 LAB — LIPID PANEL
CHOL/HDL RATIO: 2.1 ratio (ref 0.0–4.4)
Cholesterol, Total: 176 mg/dL (ref 100–199)
HDL: 84 mg/dL (ref >39)
LDL CALC: 80 mg/dL (ref 0–99)
Triglycerides: 61 mg/dL (ref 0–149)
VLDL Cholesterol Cal: 12 mg/dL (ref 5–40)

## 2014-06-12 LAB — RHEUMATOID FACTOR: Rhuematoid fact SerPl-aCnc: 11 IU/mL (ref 0.0–13.9)

## 2014-06-12 LAB — ANA: Anti Nuclear Antibody(ANA): NEGATIVE

## 2014-06-15 LAB — OVA AND PARASITE EXAMINATION

## 2014-06-27 ENCOUNTER — Ambulatory Visit (INDEPENDENT_AMBULATORY_CARE_PROVIDER_SITE_OTHER): Payer: Federal, State, Local not specified - PPO | Admitting: Family Medicine

## 2014-06-27 ENCOUNTER — Encounter: Payer: Self-pay | Admitting: Family Medicine

## 2014-06-27 VITALS — BP 138/86 | Ht 66.0 in | Wt 130.4 lb

## 2014-06-27 DIAGNOSIS — Z0000 Encounter for general adult medical examination without abnormal findings: Secondary | ICD-10-CM | POA: Diagnosis not present

## 2014-06-27 DIAGNOSIS — R739 Hyperglycemia, unspecified: Secondary | ICD-10-CM | POA: Diagnosis not present

## 2014-06-27 DIAGNOSIS — M79671 Pain in right foot: Secondary | ICD-10-CM | POA: Diagnosis not present

## 2014-06-27 DIAGNOSIS — Z1231 Encounter for screening mammogram for malignant neoplasm of breast: Secondary | ICD-10-CM | POA: Diagnosis not present

## 2014-06-27 DIAGNOSIS — M810 Age-related osteoporosis without current pathological fracture: Secondary | ICD-10-CM

## 2014-06-27 LAB — POCT GLYCOSYLATED HEMOGLOBIN (HGB A1C)

## 2014-06-27 NOTE — Progress Notes (Signed)
   Subjective:    Patient ID: Brittany Blankenship, female    DOB: 11/30/1955, 59 y.o.   MRN: [id number]  HPI The patient comes in today for a wellness visit.    A review of their health history was completed.  A review of medications was also completed.  Any needed refills; no  Eating habits: no appetite lately  Falls/  MVA accidents in past few months: none  Regular exercise: job  Specialist pt sees on regular basis: no  Preventative health issues were discussed.   Additional concerns: none Patient does state that she still smokes she know she needs to quit  She has a history of hoarseness for which she's been evaluated thoroughly and was found not to have cancer  She does have a history of osteoporosis she know she needs to get a bone density done she states she will get this done through the [place]   Review of Systems  Constitutional: Negative for activity change, appetite change and fatigue.  HENT: Negative for congestion, ear discharge and rhinorrhea.   Eyes: Negative for discharge.  Respiratory: Negative for cough, chest tightness and wheezing.   Cardiovascular: Negative for chest pain.  Gastrointestinal: Negative for vomiting and abdominal pain.  Genitourinary: Negative for frequency and difficulty urinating.  Musculoskeletal: Negative for neck pain.  Allergic/Immunologic: Negative for environmental allergies and food allergies.  Neurological: Negative for weakness and headaches.  Psychiatric/Behavioral: Negative for behavioral problems and agitation.       Objective:   Physical Exam  Constitutional: She is oriented to person, place, and time. She appears well-developed and well-nourished.  HENT:  Head: Normocephalic.  Right Ear: External ear normal.  Left Ear: External ear normal.  Eyes: Pupils are equal, round, and reactive to light.  Neck: Normal range of motion. No thyromegaly present.  Cardiovascular: Normal rate, regular rhythm, normal heart sounds and  intact distal pulses.   No murmur heard. Pulmonary/Chest: Effort normal and breath sounds normal. No respiratory distress. She has no wheezes.  Abdominal: Soft. Bowel sounds are normal. She exhibits no distension and no mass. There is no tenderness.  Musculoskeletal: Normal range of motion. She exhibits no edema or tenderness.  Lymphadenopathy:    She has no cervical adenopathy.  Neurological: She is alert and oriented to person, place, and time. She exhibits normal muscle tone.  Skin: Skin is warm and dry.  Psychiatric: She has a normal mood and affect. Her behavior is normal.    Patient defers on pelvic exam she has had a hysterectomy breast exam is normal.      Assessment & Plan:  Up-to-date on colonoscopy currently will need colonoscopy 2013  Mammogram recommended  Safety dietary measures recommended  Patient encouraged quit smoking  Has history of osteoporosis she should do bone density she states she will get this through the [place] and have them send [place] a copy.  Hemoglobin A1c is normal this was done because of patient having hyperglycemia Follow-up at least yearly

## 2014-07-17 ENCOUNTER — Ambulatory Visit: Payer: Federal, State, Local not specified - PPO | Admitting: Family Medicine

## 2014-07-26 ENCOUNTER — Ambulatory Visit (HOSPITAL_COMMUNITY)
Admission: RE | Admit: 2014-07-26 | Discharge: 2014-07-26 | Disposition: A | Discharge status: Home / Self Care | Payer: Federal, State, Local not specified - PPO | Source: Ambulatory Visit | Attending: Family Medicine | Admitting: Family Medicine

## 2014-07-26 DIAGNOSIS — Z1231 Encounter for screening mammogram for malignant neoplasm of breast: Secondary | ICD-10-CM | POA: Diagnosis present

## 2014-07-31 ENCOUNTER — Other Ambulatory Visit: Payer: Self-pay | Admitting: Family Medicine

## 2014-07-31 DIAGNOSIS — R928 Other abnormal and inconclusive findings on diagnostic imaging of breast: Secondary | ICD-10-CM

## 2014-08-07 ENCOUNTER — Other Ambulatory Visit: Payer: Self-pay | Admitting: Family Medicine

## 2014-08-07 ENCOUNTER — Telehealth: Payer: Self-pay | Admitting: Family Medicine

## 2014-08-07 DIAGNOSIS — M79671 Pain in right foot: Secondary | ICD-10-CM

## 2014-08-07 NOTE — Telephone Encounter (Signed)
Pt called to check on status of referral to podiatry at [facility] for her right foot pain, nothing in system.  If "ok" to refer, please initiate referral in system so that I may process  Pt's work# [phone] Pt's home# [phone]

## 2014-08-07 NOTE — Telephone Encounter (Signed)
It was initiated thank you

## 2014-08-17 ENCOUNTER — Ambulatory Visit
Admission: RE | Admit: 2014-08-17 | Discharge: 2014-08-17 | Disposition: A | Discharge status: Home / Self Care | Payer: Federal, State, Local not specified - PPO | Source: Ambulatory Visit | Attending: Family Medicine | Admitting: Family Medicine

## 2014-08-17 DIAGNOSIS — R928 Other abnormal and inconclusive findings on diagnostic imaging of breast: Secondary | ICD-10-CM

## 2015-07-17 ENCOUNTER — Ambulatory Visit (INDEPENDENT_AMBULATORY_CARE_PROVIDER_SITE_OTHER): Payer: Federal, State, Local not specified - PPO | Admitting: Family Medicine

## 2015-07-17 ENCOUNTER — Encounter: Payer: Self-pay | Admitting: Family Medicine

## 2015-07-17 VITALS — BP 120/70 | Temp 98.5°F | Ht 66.0 in | Wt 132.2 lb

## 2015-07-17 DIAGNOSIS — N3001 Acute cystitis with hematuria: Secondary | ICD-10-CM | POA: Diagnosis not present

## 2015-07-17 DIAGNOSIS — R300 Dysuria: Secondary | ICD-10-CM | POA: Diagnosis not present

## 2015-07-17 LAB — POCT URINALYSIS DIPSTICK
Blood, UA: 250
Spec Grav, UA: <=1.005
pH, UA: 8

## 2015-07-17 MED ORDER — CEFPROZIL 500 MG PO TABS: 500.0000 mg | ORAL_TABLET | Freq: Two times a day (BID) | ORAL | Status: DC

## 2015-07-17 NOTE — Progress Notes (Signed)
   Subjective:    Patient ID: Brittany Blankenship, female    DOB: [birth year], 60 y.o.   MRN: [id number]  Dysuria  This is a new problem. The current episode started in the past 7 days. The problem occurs intermittently. The problem has been unchanged. The quality of the pain is described as burning. The pain is moderate. There has been no fever. Associated symptoms comments: Back pain. Treatments tried: AZO, Aleve. The treatment provided mild relief.   Patient has no other concerns at this time.   Results for orders placed or performed in visit on 07/17/15  POCT urinalysis dipstick  Result Value Ref Range   Color, UA     Clarity, UA     Glucose, UA     Bilirubin, UA     Ketones, UA     Spec Grav, UA <=1.005    Blood, UA 250    pH, UA 8.0    Protein, UA     Urobilinogen, UA     Nitrite, UA     Leukocytes, UA 4+ (A) Negative  Did have some hematuria last week but it cleared up.  Review of Systems  Genitourinary: Positive for dysuria.   Dysuria urinary from C denies high fever chills sweats does relate some sharp left lower abdominal pain    Objective:   Physical Exam Lungs clear heart regular flanks nontender abdomen soft no guarding rebound   Microscopic positive for WBCs occasional RBC probable UTI    Assessment & Plan:  Dysuria/UTI/antibiotics prescribed/may use over-the-counter Azo  Culture ordered await the results  Recommend repeat urine when the patient comes in for female wellness checkup later this summer

## 2015-07-19 ENCOUNTER — Encounter: Payer: Self-pay | Admitting: Family Medicine

## 2015-07-19 LAB — URINE CULTURE

## 2015-08-23 ENCOUNTER — Ambulatory Visit: Payer: Federal, State, Local not specified - PPO | Admitting: Nurse Practitioner

## 2015-08-27 ENCOUNTER — Ambulatory Visit: Payer: Federal, State, Local not specified - PPO | Admitting: Nurse Practitioner

## 2015-08-28 ENCOUNTER — Ambulatory Visit (INDEPENDENT_AMBULATORY_CARE_PROVIDER_SITE_OTHER): Payer: Federal, State, Local not specified - PPO | Admitting: Nurse Practitioner

## 2015-08-28 ENCOUNTER — Encounter: Payer: Self-pay | Admitting: Nurse Practitioner

## 2015-08-28 VITALS — BP 132/82 | Ht 66.0 in | Wt 131.5 lb

## 2015-08-28 DIAGNOSIS — Z0000 Encounter for general adult medical examination without abnormal findings: Secondary | ICD-10-CM

## 2015-08-28 DIAGNOSIS — R319 Hematuria, unspecified: Secondary | ICD-10-CM

## 2015-08-28 DIAGNOSIS — F17200 Nicotine dependence, unspecified, uncomplicated: Secondary | ICD-10-CM

## 2015-08-28 DIAGNOSIS — Z01419 Encounter for gynecological examination (general) (routine) without abnormal findings: Secondary | ICD-10-CM

## 2015-08-28 LAB — POCT URINALYSIS DIPSTICK
Leukocytes, UA: NEGATIVE
Spec Grav, UA: <=1.005
pH, UA: 7

## 2015-08-29 ENCOUNTER — Encounter: Payer: Self-pay | Admitting: Nurse Practitioner

## 2015-08-29 DIAGNOSIS — F17200 Nicotine dependence, unspecified, uncomplicated: Secondary | ICD-10-CM | POA: Insufficient documentation

## 2015-08-29 MED ORDER — BUPROPION HCL ER (XL) 150 MG PO TB24: 150.0000 mg | ORAL_TABLET | Freq: Every day | ORAL | Status: DC

## 2015-08-29 NOTE — Progress Notes (Signed)
Subjective:    Patient ID: Brittany Blankenship, female    DOB: [birth year], 60 y.o.   MRN: [id number]  HPI presents for her wellness exam. Hysterectomy; still has ovaries. No pelvic pain. No new sexual partners. Regular vision and dental exams. Has tried Chantix to quit smoking but had side effects. Down to 1/2 ppd. Some anxiety dealing with her daughter. Planning retirement later this year. Requests rechecking urine for blood.     Review of Systems  Constitutional: Negative for activity change, appetite change and fatigue.  HENT: Negative for dental problem, ear pain, sinus pressure and sore throat.   Respiratory: Negative for cough, chest tightness, shortness of breath and wheezing.   Cardiovascular: Negative for chest pain.  Gastrointestinal: Negative for nausea, vomiting, abdominal pain, diarrhea, constipation, blood in stool and abdominal distention.  Genitourinary: Negative for dysuria, urgency, frequency, vaginal discharge, enuresis, difficulty urinating, genital sores and pelvic pain.  Psychiatric/Behavioral: The patient is nervous/anxious.        Objective:   Physical Exam  Constitutional: She is oriented to person, place, and time. She appears well-developed. No distress.  HENT:  Right Ear: External ear normal.  Left Ear: External ear normal.  Mouth/Throat: Oropharynx is clear and moist.  Neck: Normal range of motion. Neck supple. No tracheal deviation present. No thyromegaly present.  Cardiovascular: Normal rate, regular rhythm and normal heart sounds.  Exam reveals no gallop.   No murmur heard. Pulmonary/Chest: Effort normal and breath sounds normal.  Abdominal: Soft. She exhibits no distension. There is no tenderness.  Genitourinary: Vagina normal. No vaginal discharge found.  External GU: pale; no rashes or lesions. Vagina: pale and slightly dry. Bimanual exam: no tenderness or obvious masses.   Musculoskeletal: She exhibits no edema.  Lymphadenopathy:    She has no  cervical adenopathy.  Neurological: She is alert and oriented to person, place, and time.  Skin: Skin is warm and dry. No rash noted.  Significant sun damage.   Psychiatric: She has a normal mood and affect. Her behavior is normal.  Vitals reviewed. Breast exam: no masses; axillae no adenopathy.  Results for orders placed or performed in visit on 08/28/15  POCT urinalysis dipstick  Result Value Ref Range   Color, UA Yellow    Clarity, UA Clear    Glucose, UA     Bilirubin, UA     Ketones, UA     Spec Grav, UA <=1.005    Blood, UA     pH, UA 7.0    Protein, UA     Urobilinogen, UA     Nitrite, UA     Leukocytes, UA Negative Negative          Assessment & Plan:   Problem List Items Addressed This Visit      Other   Tobacco use disorder    Other Visit Diagnoses    Well woman exam    -  Primary    Relevant Orders    CBC with Differential/Platelet    Lipid panel    Hepatic function panel    Basic metabolic panel    TSH    VITAMIN D 25 Hydroxy (Vit-D Deficiency, Fractures)    Hematuria   resolved    Relevant Orders    POCT urinalysis dipstick (Completed)      Meds ordered this encounter  Medications  . buPROPion (WELLBUTRIN XL) 150 MG 24 hr tablet    Sig: Take 1 tablet (150 mg total) by mouth daily.  Dispense:  30 tablet    Refill:  2    Order Specific Question:  Supervising Provider    Answer:  Mikey Kirschner [[id number]]   Discussed smoking cessation and daily vitamin D/calcium. Recommend skin cancer screening and bone density study. Plans to get these through [place] next year. Will schedule her own appointment with Dr. Laural Golden for colonoscopy.  Return in about 1 year (around 08/27/2016) for physical.

## 2015-12-10 ENCOUNTER — Encounter: Payer: Self-pay | Admitting: Family Medicine

## 2015-12-10 ENCOUNTER — Ambulatory Visit (INDEPENDENT_AMBULATORY_CARE_PROVIDER_SITE_OTHER): Payer: Federal, State, Local not specified - PPO | Admitting: Family Medicine

## 2015-12-10 DIAGNOSIS — K219 Gastro-esophageal reflux disease without esophagitis: Secondary | ICD-10-CM | POA: Diagnosis not present

## 2015-12-10 DIAGNOSIS — F32A Depression, unspecified: Secondary | ICD-10-CM

## 2015-12-10 DIAGNOSIS — F419 Anxiety disorder, unspecified: Secondary | ICD-10-CM

## 2015-12-10 DIAGNOSIS — F418 Other specified anxiety disorders: Secondary | ICD-10-CM | POA: Diagnosis not present

## 2015-12-10 DIAGNOSIS — F329 Major depressive disorder, single episode, unspecified: Secondary | ICD-10-CM

## 2015-12-10 MED ORDER — SERTRALINE HCL 50 MG PO TABS: 50.0000 mg | ORAL_TABLET | Freq: Every day | ORAL | 3 refills | Status: DC

## 2015-12-10 MED ORDER — PANTOPRAZOLE SODIUM 40 MG PO TBEC
DELAYED_RELEASE_TABLET | ORAL | 2 refills | Status: DC
Start: 2015-12-10 — End: 2016-03-14

## 2015-12-10 MED ORDER — ZOLPIDEM TARTRATE 5 MG PO TABS
5.0000 mg | ORAL_TABLET | Freq: Every evening | ORAL | 1 refills | Status: DC | PRN
Start: 2015-12-10 — End: 2016-06-16

## 2015-12-10 NOTE — Progress Notes (Signed)
Patient comes in today having some upper abdominal discomfort. Also denies any dysphagia. No chest tightness pressure pain no shortness of breath. In addition to this she denies any rectal bleeding. No constipation issues. Denies any generalized abdominal pain. No rash. No fever sweats or chills.  Patient is been under a lot of stress recently. She has a child who is suffered with addiction issues who is now grown still having addiction issues. Recently her and her husband had to assess their daughter to leave the house. She has no idea where she is now  She also relates having a lot of anxiousness sadness feeling down and blue denies being suicidal.  Her husband is gone through a loss of job has difficult time with his elderly parents who are dealing with her own health issues  The patient relates severe difficulties at her workplace with anxiousness stress in dealing with a particular employee who at times is very irritable in partly mean  PMH reflux osteoporosis has had abdominal hysterectomy. Has had colonoscopy and EGD  Vital signs noted weight stable blood pressure good HEENT no masses throat normal Lungs clear no crackles Heart regular Abdomen soft mild epigastric tenderness Extremities no edema  Long discussion held with the patient. Questionnaire regarding depression and anxiety filled out by the patient and reviewed while the patient was present  Assessment and plan anxiety with depression recommend SSRI Ambien as necessary for sleep use sparingly cautioned drowsiness Severe dyspepsia with GERD-PPI if not improving over the next few weeks referral to gastroenterology If not improved by the follow-up visit then lab work will be indicated possibly imaging Because of the severe nature of her symptoms with the depression and anxiety this patient is not able to continue with work. I recommend for her to take a leave of absence for 30 days. She will do counseling through her work  provided counseling. In addition to this she will follow-up here in 3 weeks. If she becomes suicidal she'll immediately call us in get checked in the ER Follow-up sooner if any problems

## 2016-01-02 ENCOUNTER — Telehealth: Payer: Self-pay | Admitting: Family Medicine

## 2016-01-02 DIAGNOSIS — F419 Anxiety disorder, unspecified: Secondary | ICD-10-CM

## 2016-01-02 DIAGNOSIS — F329 Major depressive disorder, single episode, unspecified: Secondary | ICD-10-CM

## 2016-01-02 DIAGNOSIS — F32A Depression, unspecified: Secondary | ICD-10-CM

## 2016-01-02 NOTE — Telephone Encounter (Signed)
Set up psychology referral ( [facility] is fine) [phone]

## 2016-01-02 NOTE — Telephone Encounter (Signed)
Spoke with patient and informed her per Dr.Scott Luking- we put in referral for counselor. Patient verbalized understanding.

## 2016-01-02 NOTE — Telephone Encounter (Signed)
Pt called and is wanting a referral to a counselor. Pt states that she is having a hard time getting one through her employer.

## 2016-01-07 ENCOUNTER — Ambulatory Visit: Payer: Federal, State, Local not specified - PPO | Admitting: Family Medicine

## 2016-01-08 ENCOUNTER — Encounter: Payer: Self-pay | Admitting: Family Medicine

## 2016-01-08 ENCOUNTER — Ambulatory Visit (INDEPENDENT_AMBULATORY_CARE_PROVIDER_SITE_OTHER): Payer: Federal, State, Local not specified - PPO | Admitting: Family Medicine

## 2016-01-08 VITALS — BP 142/86 | Ht 66.0 in | Wt 137.5 lb

## 2016-01-08 DIAGNOSIS — M19042 Primary osteoarthritis, left hand: Secondary | ICD-10-CM

## 2016-01-08 DIAGNOSIS — K219 Gastro-esophageal reflux disease without esophagitis: Secondary | ICD-10-CM

## 2016-01-08 DIAGNOSIS — M19041 Primary osteoarthritis, right hand: Secondary | ICD-10-CM | POA: Insufficient documentation

## 2016-01-08 DIAGNOSIS — F320 Major depressive disorder, single episode, mild: Secondary | ICD-10-CM | POA: Diagnosis not present

## 2016-01-08 NOTE — Progress Notes (Signed)
   Subjective:    Patient ID: Brittany Blankenship, female    DOB: 10/08/1955, 60 y.o.   MRN: [id number]  Anxiety  Presents for follow-up visit. Symptoms include excessive worry and nervous/anxious behavior.    Patient has concerns of epigastric pain- the patient states she has continued epigastric pain more on the left epigastrium region no right upper quadrant tenderness. No nausea vomiting. The pain is present during the day as well as in the evening. Does not wake her up at night. Does not seem to get better or worse with eating. States medication did not help this. Denies any reflux symptoms. She is had problems in the past of gastritis. She is a smoker she is getting older.  , and arthritis pain to hands and feet. She relates intermittent stiffness in the hands in the morning time and tenderness in the joints she's always worked with her hands at the [employer]. She relates some changes in the joints as well   she is also been treated recently for depression and anxiety related to multiple things including her job, her husbands declining health, her daughter's drug issues in the fact that she is left and they do not know where she has the patient denies being suicidal. She states she finds herself very stressed. She feels that she would do better with counseling.  Review of Systems  Psychiatric/Behavioral: The patient is nervous/anxious.    denies chest tightness pressure pain shortness of breath does relate epigastric pain no nausea vomiting no diarrhea rectal bleeding     Objective:   Physical Exam  lungs clear no crackles heart is regular no murmurs abdomen soft mild epigastric tenderness no guarding rebound.   force we'll wait the results of lab work then we will connect with gastroenterology for possible EGD     Assessment & Plan:  Depression doing better on medication  -psychology referral has been put in place with the patient needs to know where she needs to go to get her  counseling she would like to start this right away  Epigastric pain labs ordered I believe patient would benefit from seeing Dr. Dereck Leep in having another endoscopy  patient will follow-up here in approximately mid December for recheck she is been given a work note through the end of the year patient in capable working with her current condition she needs psychotherapy as well as medication to recover Patient has been counseled to quit smoking  Patient has been counseled to stay out of work through the end of the year to receive counseling see discussion above

## 2016-01-09 LAB — HEPATIC FUNCTION PANEL
ALK PHOS: 92 IU/L (ref 39–117)
ALT: 16 IU/L (ref 0–32)
AST: 18 IU/L (ref 0–40)
Albumin: 4.6 g/dL (ref 3.6–4.8)
BILIRUBIN, DIRECT: 0.09 mg/dL (ref 0.00–0.40)
Bilirubin Total: 0.3 mg/dL (ref 0.0–1.2)
TOTAL PROTEIN: 6.6 g/dL (ref 6.0–8.5)

## 2016-01-09 LAB — LIPASE: Lipase: 59 U/L (ref 14–72)

## 2016-01-09 LAB — CBC WITH DIFFERENTIAL/PLATELET
BASOS ABS: 0.1 10*3/uL (ref 0.0–0.2)
Basos: 1 %
EOS (ABSOLUTE): 0.3 10*3/uL (ref 0.0–0.4)
EOS: 3 %
HEMATOCRIT: 44 % (ref 34.0–46.6)
HEMOGLOBIN: 14.6 g/dL (ref 11.1–15.9)
IMMATURE GRANS (ABS): 0 10*3/uL (ref 0.0–0.1)
IMMATURE GRANULOCYTES: 0 %
LYMPHS: 31 %
Lymphocytes Absolute: 2.8 10*3/uL (ref 0.7–3.1)
MCH: 31.3 pg (ref 26.6–33.0)
MCHC: 33.2 g/dL (ref 31.5–35.7)
MCV: 94 fL (ref 79–97)
MONOCYTES: 5 %
Monocytes Absolute: 0.5 10*3/uL (ref 0.1–0.9)
Neutrophils Absolute: 5.3 10*3/uL (ref 1.4–7.0)
Neutrophils: 60 %
Platelets: 347 10*3/uL (ref 150–379)
RBC: 4.67 x10E6/uL (ref 3.77–5.28)
RDW: 13.3 % (ref 12.3–15.4)
WBC: 8.9 10*3/uL (ref 3.4–10.8)

## 2016-01-09 LAB — RHEUMATOID FACTOR

## 2016-01-09 LAB — SEDIMENTATION RATE: SED RATE: 2 mm/h (ref 0–40)

## 2016-01-14 ENCOUNTER — Telehealth (HOSPITAL_COMMUNITY): Payer: Self-pay | Admitting: *Deleted

## 2016-01-14 NOTE — Telephone Encounter (Signed)
left voice message regarding appointment. 

## 2016-01-15 ENCOUNTER — Ambulatory Visit (INDEPENDENT_AMBULATORY_CARE_PROVIDER_SITE_OTHER): Payer: Federal, State, Local not specified - PPO | Admitting: Psychiatry

## 2016-01-15 ENCOUNTER — Encounter (HOSPITAL_COMMUNITY): Payer: Self-pay | Admitting: Psychiatry

## 2016-01-15 DIAGNOSIS — F321 Major depressive disorder, single episode, moderate: Secondary | ICD-10-CM | POA: Diagnosis not present
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
## 2016-01-29 ENCOUNTER — Ambulatory Visit (INDEPENDENT_AMBULATORY_CARE_PROVIDER_SITE_OTHER): Payer: Federal, State, Local not specified - PPO | Admitting: Psychiatry

## 2016-01-29 ENCOUNTER — Encounter (HOSPITAL_COMMUNITY): Payer: Self-pay | Admitting: Psychiatry

## 2016-01-29 DIAGNOSIS — F321 Major depressive disorder, single episode, moderate: Secondary | ICD-10-CM

## 2016-01-29 NOTE — Progress Notes (Signed)
Patient:  Brittany Blankenship   DOB: 01-22-1956  MR Number: [id number]  Location: [facility]:  [address] [place],  [place], [address]  Start: Tuesday 01/29/2016 9:30 AM End: Tuesday 01/29/2016 10:32 AM  Provider/Observer:     Maurice Small, MSW, LCSW   Chief Complaint:      Chief Complaint  Patient presents with  . Depression    Reason For Service:     Brittany Blankenship is a 60 y.o. female who presents with symptoms of anxiety and depression that began around September 2017. At that time, patient was having issues with daughter who has had substance abuse issues for several years and has been in and out of patient's home along with going to  several rehabilitation programs. She and daughter had conflict and patient has not seen her since September 2017. She also was experiencing issues on job with employee accusing patient of creating hostile environment. 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 Patient reports becoming overwhelmed with this along with the multiple stressors she was already facing. Patient has been out on medical leave since October 2017. Symptoms  have worsened over the past several months. She reports no psychiatric hospitalizations and no previous involvment in therapy. She is taking zoloft and ambien as prescribed by PCP. She does attend Al-Anon.  Interventions Strategy:  Supportive  Participation Level:   Active  Participation Quality:  Appropriate      Behavioral Observation:  Casual, Alert, and Tearful.   Current Psychosocial Factors: Daughter has substance abuse issues, job stress  Content of Session:   Reviewed symptoms, administered depression screen, discussed losses, facilitated expression of feelings, assisted patient identify ways to improve self-care, and increase involvement in daily activity through use of daily planning.  Current Status:    depressed mood, loss of  interest, isolative behaviors, ruminating thoughts, anxiety, panic attacks, and poor motivation   Suicidal/Homicidal:    No  Patient Progress:   Fair. Patient reports little to no change in symptoms since assessment session. She continues to worry about husband. She also expresses deep sadness about daughter and has ambivalent feelings about trying to contact her for her birthday which is next week. Patient also has grief and loss issues related to several losses in the past 4-5 years including her mother and her sponsor from [place]. She reports feeling lonely and having little support from her biological family. She also is experiencing stress related to job and remains on medical leave. She reports little involvement in activity and minimal social contact.   Target Goals:   1. Establish rapport. 2. Learn and implement behavioral strategies to overcome depression  Last Reviewed:     Goals Addressed Today:    1, 2  Plan:      Return again in 2  weeks.  Impression/Diagnosis:    Patient presents with symptoms of anxiety and depression that began around September 2017. At that time, patient was having issues with daughter who has substance abuse issues. She also was experiencing significant job stress. Patient has been out on medical leave since October 2017.  Symptoms  have worsened and include depressed mood, loss of interest, isolative behaviors, ruminating thoughts, anxiety, panic attacks, and poor motivation. Patient reports no previous depressive episodes or manic behaviors.   Diagnosis:  Axis I: Major depressive disorder, single episode, moderate          Axis II: Deferred  [place], [place] 01/29/2016

## 2016-02-04 ENCOUNTER — Encounter: Payer: Self-pay | Admitting: Family Medicine

## 2016-02-04 ENCOUNTER — Ambulatory Visit (INDEPENDENT_AMBULATORY_CARE_PROVIDER_SITE_OTHER): Payer: Federal, State, Local not specified - PPO | Admitting: Family Medicine

## 2016-02-04 VITALS — BP 138/86 | Temp 98.4°F | Ht 66.0 in | Wt 139.0 lb

## 2016-02-04 DIAGNOSIS — J209 Acute bronchitis, unspecified: Secondary | ICD-10-CM | POA: Diagnosis not present

## 2016-02-04 DIAGNOSIS — J329 Chronic sinusitis, unspecified: Secondary | ICD-10-CM

## 2016-02-04 MED ORDER — AMOXICILLIN-POT CLAVULANATE 875-125 MG PO TABS: ORAL_TABLET | ORAL | 0 refills | Status: DC

## 2016-02-04 NOTE — Progress Notes (Signed)
   Subjective:    Patient ID: Brittany Blankenship, female    DOB: Mar 02, 1956, 60 y.o.   MRN: [id number]  Sinusitis  This is a new problem. The current episode started in the past 7 days. Associated symptoms include congestion, coughing, headaches and a sore throat. (Fever, diarrhea) Treatments tried: aleve, tylenol cold.   Some prod cough  Bad cough   No hi fever  Sone chils and hot    Low-grade fever diminished energy  Review of Systems  HENT: Positive for congestion and sore throat.   Respiratory: Positive for cough.   Neurological: Positive for headaches.       Objective:   Physical Exam  Alert, mild malaise. Hydration good Vitals stable. frontal/ maxillary tenderness evident positive nasal congestion. pharynx normal neck supple  lungs clear/no crackles or wheezes. heart regular in rhythm       Assessment & Plan:  Impression rhinosinusitis/Bronchitis likely post viral, discussed with patient. plan antibiotics prescribed. Questions answered. Symptomatic care discussed. warning signs discussed. WSL

## 2016-02-12 ENCOUNTER — Ambulatory Visit (INDEPENDENT_AMBULATORY_CARE_PROVIDER_SITE_OTHER): Payer: Federal, State, Local not specified - PPO | Admitting: Psychiatry

## 2016-02-12 ENCOUNTER — Encounter (HOSPITAL_COMMUNITY): Payer: Self-pay | Admitting: Psychiatry

## 2016-02-12 DIAGNOSIS — F321 Major depressive disorder, single episode, moderate: Secondary | ICD-10-CM

## 2016-02-12 NOTE — Progress Notes (Signed)
Patient:  Brittany Blankenship   DOB: [birth year]  MR Number: [id number]  Location: [facility]:  [address] [place],  [place], [address]  Start: Tuesday 02/12/2016 9:10 AM End: Tuesday 02/12/2016 10:05 AM  Provider/Observer:     Maurice Small, MSW, LCSW   Chief Complaint:      Chief Complaint  Patient presents with  . Depression    Reason For Service:     Brittany Blankenship is a 60 y.o. female who presents with symptoms of anxiety and depression that began around September 2017. At that time, patient was having issues with daughter who has had substance abuse issues for several years and has been in and out of patient's home along with going to  several rehabilitation programs. She and daughter had conflict and patient has not seen her since September 2017. She also was experiencing issues on job with employee accusing patient of creating hostile environment. 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 Patient reports becoming overwhelmed with this along with the multiple stressors she was already facing. Patient has been out on medical leave since October 2017. Symptoms  have worsened over the past several months. She reports no psychiatric hospitalizations and no previous involvment in therapy. She is taking zoloft and ambien as prescribed by PCP. She d              oes attend Al-Anon.  Interventions Strategy:  Supportive  Participation Level:   Active  Participation Quality:  Appropriate      Behavioral Observation:  Casual, Alert, and Tearful.   Current Psychosocial Factors: Daughter has substance abuse issues, job stress  Content of Session:   Reviewed symptoms, facilitated expression of feelings, assisted patient identify ways to continue to increased involvement in activity/improve self-care and maintain consistency, continued to discuss losses and changes in patient's life,   Current Status:     depressed mood, increased interest in activities  isolative behaviors, ruminating thoughts, anxiety,decreased panic attacks, and increased  Motivation, low wnergy   Suicidal/Homicidal:    No  Patient Progress:   Fair. Patient reports efforts to improve self care through daily planning. She reports visiting in-laws in [place], [place] and preparing Thanksgiving dinner. She reports this was helpful. She also reports receiving a letter from her daughter this past weekend. Patient reports some relief of knowing daughter is participating in a program but also remains crcumspect as daughter has been in and out of programs. She continues to experience feelings of  sadness and helplessness regarding daughter. She also continues to experience loss issues related to her job and her usual routine. Patient has made efforts to contact a friend and reports talking to her on the phone this past Sunday for about an hour. She reports this was helpful.   Target Goals:   1.  Learn and implement behavioral strategies to overcome depression  Last Reviewed:     Goals Addressed Today:    1  Plan:      Return again in 2  weeks.  Impression/Diagnosis:    Patient presents with symptoms of anxiety and depression that began around September 2017. At that time, patient was having issues with daughter who has substance abuse issues. She also was experiencing significant job stress. Patient has been out on medical leave since October 2017.  Symptoms  have worsened and include depressed mood, loss of interest, isolative behaviors, ruminating thoughts, anxiety,  panic attacks, and poor motivation. Patient reports no previous depressive episodes or manic behaviors.   Diagnosis:  Axis I: Major depressive disorder, single episode, moderate          Axis II: Deferred    BYNUM,PEGGY, LCSW 02/12/2016

## 2016-02-28 ENCOUNTER — Ambulatory Visit (INDEPENDENT_AMBULATORY_CARE_PROVIDER_SITE_OTHER): Payer: Federal, State, Local not specified - PPO | Admitting: Psychiatry

## 2016-02-28 ENCOUNTER — Ambulatory Visit: Payer: Federal, State, Local not specified - PPO | Admitting: Family Medicine

## 2016-02-28 DIAGNOSIS — F321 Major depressive disorder, single episode, moderate: Secondary | ICD-10-CM | POA: Diagnosis not present

## 2016-02-28 NOTE — Progress Notes (Signed)
Patient:  Brittany Blankenship   DOB: [birth year]  MR Number: [id number]  Location: [facility]:  [address] [place],  [place], [address]  Start: Thursday 02/28/2016 3:10 PM End: Thursday 02/28/2016 4:05 PM     Provider/Observer:     Maurice Small, MSW, LCSW   Chief Complaint:      No chief complaint on file.   Reason For Service:     Brittany Blankenship is a 60 y.o. female who presents with symptoms of anxiety and depression that began around September 2017. At that time, patient was having issues with daughter who has had substance abuse issues for several years and has been in and out of patient's home along with going to  several rehabilitation programs. She and daughter had conflict and patient has not seen her since September 2017. She also was experiencing issues on job with employee accusing patient of creating hostile environment. 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 Patient reports becoming overwhelmed with this along with the multiple stressors she was already facing. Patient has been out on medical leave since October 2017. Symptoms  have worsened over the past several months. She reports no psychiatric hospitalizations and no previous involvment in therapy. She is taking zoloft and ambien as prescribed by PCP. She d              oes attend [employer].  Interventions Strategy:  Supportive  Participation Level:   Active  Participation Quality:  Appropriate      Behavioral Observation:  Casual, Alert, and Tearful.   Current Psychosocial Factors: Daughter has substance abuse issues, job stress  Content of Session:   Reviewed symptoms, facilitated expression of feelings, assisted patient identify strengths and supports, develop treatment plan ways to continue to increased involvement in activity,  continued to discuss losses and changes in patient's life,   Current Status:    depressed mood, increased  interest in activities  isolative behaviors, ruminating thoughts, anxiety,decreased panic attacks, and increased  Motivation, low wnergy   Suicidal/Homicidal:    No  Patient Progress:   Fair. Patient reports continued efforts to improve self care and increased involvement in activity. She reports Christmas shopping, cooking, talking to her brother, and reading. She also has been using her spirituality doing a lot of praying This has been helpful. Patient also reports she has decided to retire and expresses ambivalent feelings regarding this. She also expresses relief she has made a decision. She continues to miss her daughter but has had no contact with daughter. She expresses relief husband's recent MRI indicated no problems.   Target Goals:   1, verbalize feelings associated with losses and transitions    2.  Learn and implement behavioral strategies to overcome depression  Last Reviewed:   02/28/2016  Goals Addressed Today:    1,2  Plan:      Return again in 2  weeks.  Impression/Diagnosis:    Patient presents with symptoms of anxiety and depression that began around September 2017. At that time, patient was having issues with daughter who has substance abuse issues. She also was experiencing significant job stress. Patient has been out on medical leave since October 2017.  Symptoms  have worsened and include depressed mood, loss of interest, isolative behaviors, ruminating thoughts, anxiety, panic attacks, and poor motivation. Patient reports no previous depressive episodes or manic behaviors.   Diagnosis:  Axis I: Major depressive  disorder, single episode, moderate          Axis II: Deferred    Brittany Abdella, LCSW 02/28/2016

## 2016-03-12 ENCOUNTER — Emergency Department (HOSPITAL_COMMUNITY)
Admission: EM | Admit: 2016-03-12 | Discharge: 2016-03-12 | Disposition: A | Discharge status: Home / Self Care | Payer: Federal, State, Local not specified - PPO | Attending: Emergency Medicine | Admitting: Emergency Medicine

## 2016-03-12 ENCOUNTER — Emergency Department (HOSPITAL_COMMUNITY): Payer: Federal, State, Local not specified - PPO

## 2016-03-12 ENCOUNTER — Encounter (HOSPITAL_COMMUNITY): Payer: Self-pay | Admitting: Emergency Medicine

## 2016-03-12 ENCOUNTER — Telehealth: Payer: Self-pay | Admitting: Family Medicine

## 2016-03-12 DIAGNOSIS — Y999 Unspecified external cause status: Secondary | ICD-10-CM | POA: Diagnosis not present

## 2016-03-12 DIAGNOSIS — S92335A Nondisplaced fracture of third metatarsal bone, left foot, initial encounter for closed fracture: Secondary | ICD-10-CM | POA: Insufficient documentation

## 2016-03-12 DIAGNOSIS — W010XXA Fall on same level from slipping, tripping and stumbling without subsequent striking against object, initial encounter: Secondary | ICD-10-CM | POA: Insufficient documentation

## 2016-03-12 DIAGNOSIS — F17200 Nicotine dependence, unspecified, uncomplicated: Secondary | ICD-10-CM | POA: Insufficient documentation

## 2016-03-12 DIAGNOSIS — S92345A Nondisplaced fracture of fourth metatarsal bone, left foot, initial encounter for closed fracture: Secondary | ICD-10-CM | POA: Diagnosis not present

## 2016-03-12 DIAGNOSIS — Y929 Unspecified place or not applicable: Secondary | ICD-10-CM | POA: Insufficient documentation

## 2016-03-12 DIAGNOSIS — Y939 Activity, unspecified: Secondary | ICD-10-CM | POA: Diagnosis not present

## 2016-03-12 DIAGNOSIS — S99922A Unspecified injury of left foot, initial encounter: Secondary | ICD-10-CM | POA: Diagnosis present

## 2016-03-12 MED ORDER — OXYCODONE-ACETAMINOPHEN 5-325 MG PO TABS: 1.0000 | ORAL_TABLET | ORAL | 0 refills | Status: DC | PRN

## 2016-03-12 NOTE — Discharge Instructions (Signed)
Follow-up with foot surgeon at [facility] that you have previously seen.

## 2016-03-12 NOTE — Telephone Encounter (Signed)
Nurse's-please communicate with patient. Certainly we are sorry to hear that she broke her foot. I hope she gets better soon. It is advisable for her to get bone density testing done it is been several years since she has had one(unless of course she has one somewhere else that we were not aware of) she may not fill up to doing bone density currently but she may be willing to do it in 3-4 weeks and therefore we can schedule it. If the patient would rather just discuss this when she follows up at would be acceptable.

## 2016-03-12 NOTE — Telephone Encounter (Signed)
Calling to let Dr. Nicki Reaper know that she was just released from the hospital due to breaking bones in her foot.  She had to cancel her follow up appointment for this week and will call to reschedule.

## 2016-03-12 NOTE — ED Provider Notes (Signed)
[facility] DEPT Provider Note   CSN: [id number] Arrival date & time: 03/12/16  1112    By signing my name below, I, Macon Large, attest that this documentation has been prepared under the direction and in the presence of Virgel Manifold, MD. Electronically Signed: Macon Large, ED Scribe. 03/12/16. 12:57 PM.  History   Chief Complaint Chief Complaint  Patient presents with  . Foot Pain   The history is provided by the patient and the spouse. No language interpreter was used.   HPI Comments: JENNYLEE DELLAROCCA is a 60 y.o. female with PMHx of osteoporosis, who presents to the Emergency Department complaining of moderate, constant, bilateral foot pain s/p fall that occurred last night. Pt states she tripped over her family dog outside, falling and injuring both feet. Pt denies head injury or LOC. She notes her right foot was dragged against the gate. Pt notes associated swelling to both of her feet. She states her foot pain is exacerbated with bearing weight. She notes being able to place some weight on her right heel but none on her left foot. No alleviating factors noted. Pt denies fever, nausea, vomiting and HA. Pt denies any further injuries.   Past Medical History:  Diagnosis Date  . GERD (gastroesophageal reflux disease)   . Osteoporosis   . Vocal cord polyps     Patient Active Problem List   Diagnosis Date Noted  . Mild single current episode of major depressive disorder ([place]) 01/08/2016  . Primary osteoarthritis of both hands 01/08/2016  . Tobacco use disorder 08/29/2015  . Abdominal pain, epigastric 01/26/2013  . Osteoporosis 01/26/2013  . GERD (gastroesophageal reflux disease) 01/26/2013    Past Surgical History:  Procedure Laterality Date  . ABDOMINAL HYSTERECTOMY     partial  . COLONOSCOPY  10/2005   polyps  . ESOPHAGOGASTRODUODENOSCOPY N/A 02/03/2013   Procedure: ESOPHAGOGASTRODUODENOSCOPY (EGD);  Surgeon: Rogene Houston, MD;  [facility];   Service: Endoscopy;  Laterality: N/A;  325  . MOUTH SURGERY      OB History    No data available       Home Medications    Prior to Admission medications   Medication Sig Start Date End Date Taking? Authorizing Provider  amoxicillin-clavulanate (AUGMENTIN) 875-125 MG tablet One bid ten d, generic 02/04/16   Mikey Kirschner, MD  naproxen sodium (ANAPROX) 220 MG tablet Take 220 mg by mouth 2 (two) times daily with a meal.    Historical Provider, MD  oxyCODONE-acetaminophen (PERCOCET/ROXICET) 5-325 MG tablet Take 1 tablet by mouth every 4 (four) hours as needed for severe pain. 03/12/16   Virgel Manifold, MD  pantoprazole (PROTONIX) 40 MG tablet Take 1 tablet by mouth daily 12/10/15   Kathyrn Drown, MD  sertraline (ZOLOFT) 50 MG tablet Take 1 tablet (50 mg total) by mouth daily. 12/10/15   Kathyrn Drown, MD  zolpidem (AMBIEN) 5 MG tablet Take 1 tablet (5 mg total) by mouth at bedtime as needed for sleep. 12/10/15   Kathyrn Drown, MD    Family History Family History  Problem Relation Age of Onset  . Diabetes Brother   . Alcohol abuse Maternal Grandfather   . Drug abuse Daughter     Social History Social History  Substance Use Topics  . Smoking status: Current Every Day Smoker    Packs/day: 0.50    Years: 30.00  . Smokeless tobacco: Never Used     Comment: 1/2 pack a day  . Alcohol use  1.2 oz/week    2 Cans of beer per week     Comment: a couple of beers about every other week.     Allergies   Levaquin [levofloxacin in d5w] and Voltaren [diclofenac sodium]   Review of Systems Review of Systems  Constitutional: Negative for fever.  Gastrointestinal: Negative for nausea and vomiting.  Musculoskeletal: Positive for arthralgias and joint swelling.  Neurological: Negative for syncope and headaches.  All other systems reviewed and are negative.    Physical Exam Updated Vital Signs BP 157/77 (BP Location: Left Arm)   Pulse 82   Temp 97.7 F (36.5 C) (Oral)   Resp 16    Ht 5\' 6"  (1.676 m)   Wt 130 lb (59 kg)   SpO2 100%   BMI 20.98 kg/m   Physical Exam  Constitutional: She appears well-developed and well-nourished. No distress.  HENT:  Head: Normocephalic and atraumatic.  Mouth/Throat: Oropharynx is clear and moist. No oropharyngeal exudate.  Eyes: Conjunctivae and EOM are normal. Pupils are equal, round, and reactive to light. Right eye exhibits no discharge. Left eye exhibits no discharge. No scleral icterus.  Neck: Normal range of motion. Neck supple. No JVD present. No thyromegaly present.  Cardiovascular: Normal rate, regular rhythm, normal heart sounds and intact distal pulses.  Exam reveals no gallop and no friction rub.   No murmur heard. Pulmonary/Chest: Effort normal and breath sounds normal. No respiratory distress. She has no wheezes. She has no rales.  Abdominal: Soft. Bowel sounds are normal. She exhibits no distension and no mass. There is no tenderness.  Musculoskeletal: Normal range of motion. She exhibits tenderness. She exhibits no edema.  Swelling, ecchymosis and tenderness dorsal aspects both feet. Palpable DP pulses. Wiggle and move all toes. Sensation intact to light touch.   Lymphadenopathy:    She has no cervical adenopathy.  Neurological: She is alert. Coordination normal.  Skin: Skin is warm and dry. No rash noted. No erythema.  Psychiatric: She has a normal mood and affect. Her behavior is normal.  Nursing note and vitals reviewed.    ED Treatments / Results   DIAGNOSTIC STUDIES: Oxygen Saturation is 100% on RA, normal by my interpretation.    COORDINATION OF CARE: 12:50 PM Discussed treatment plan with pt at bedside which includes right and left foot imaging, f/u with pediatrist and CAM walker and pt agreed to plan. Pt was offered pain medication and crutches and pt denied.    Labs (all labs ordered are listed, but only abnormal results are displayed) Labs Reviewed - No data to display  EKG  EKG  Interpretation None       Radiology Dg Foot Complete Left  Result Date: 03/12/2016 CLINICAL DATA:  Pain following fall EXAM: LEFT FOOT - COMPLETE 3+ VIEW COMPARISON:  August 17, 2012 FINDINGS: Frontal, oblique, and lateral views were obtained. There is soft tissue swelling over the dorsal midfoot region. There are fractures of the proximal third and fourth metatarsals which appear in near anatomic alignment. No other fractures are appreciable by radiography. No dislocation. There is osteoarthritic change in the first MTP joint with early bunion formation in this area. Other joint spaces appear unremarkable. No erosive change. IMPRESSION: Fractures of the proximal third and fourth metatarsals with alignment near anatomic by radiography. There is soft tissue swelling dorsally in this region. No other fractures are evident. No dislocation. There is osteoarthritic change with early bunion formation first MTP joint. Electronically Signed   By: Lowella Grip III M.D.  On: 03/12/2016 12:01   Dg Foot Complete Right  Result Date: 03/12/2016 CLINICAL DATA:  Pain following fall EXAM: RIGHT FOOT COMPLETE - 3+ VIEW COMPARISON:  May 31, 2014 FINDINGS: Frontal, oblique, and lateral views were obtained. No fracture or dislocation is evident. There is moderate osteoarthritic change of the first MTP joint with bony overgrowth along the medial distal first metatarsal with bunion formation. Other joint spaces appear unremarkable. No erosive change. IMPRESSION: Osteoarthritic change with bunion formation first MTP joint. No acute fracture or dislocation. Electronically Signed   By: Lowella Grip III M.D.   On: 03/12/2016 12:03    Procedures Procedures (including critical care time)  Medications Ordered in ED Medications - No data to display   Initial Impression / Assessment and Plan / ED Course  I have reviewed the triage vital signs and the nursing notes.  Pertinent labs & imaging results that were  available during my care of the patient were reviewed by me and considered in my medical decision making (see chart for details).  Clinical Course     60 year old female with pain after fall. Imaging as above. Splint. Nonweightbearing. Has seen a pain medication. 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  Final Clinical Impressions(s) / ED Diagnoses   Final diagnoses:  Closed nondisplaced fracture of third metatarsal bone of left foot, initial encounter  Closed nondisplaced fracture of fourth metatarsal bone of left foot, initial encounter    New Prescriptions New Prescriptions   OXYCODONE-ACETAMINOPHEN (PERCOCET/ROXICET) 5-325 MG TABLET    Take 1 tablet by mouth every 4 (four) hours as needed for severe pain.    I personally preformed the services scribed in my presence. The recorded information has been reviewed is accurate. Virgel Manifold, MD.      Virgel Manifold, MD 03/14/16 1324

## 2016-03-12 NOTE — ED Triage Notes (Signed)
Patient states she tripped over dog gate last night and injured her left and right feet. States she can't put pressure on her feet.

## 2016-03-13 ENCOUNTER — Ambulatory Visit (HOSPITAL_COMMUNITY): Payer: Self-pay | Admitting: Psychiatry

## 2016-03-13 NOTE — Telephone Encounter (Signed)
Left message on voicemail to return call.

## 2016-03-14 ENCOUNTER — Other Ambulatory Visit: Payer: Self-pay | Admitting: Family Medicine

## 2016-03-14 ENCOUNTER — Ambulatory Visit: Payer: Federal, State, Local not specified - PPO | Admitting: Family Medicine

## 2016-03-19 ENCOUNTER — Other Ambulatory Visit: Payer: Self-pay | Admitting: *Deleted

## 2016-03-19 MED ORDER — SERTRALINE HCL 50 MG PO TABS: 50.0000 mg | ORAL_TABLET | Freq: Every day | ORAL | 0 refills | Status: DC

## 2016-03-19 MED ORDER — PANTOPRAZOLE SODIUM 40 MG PO TBEC: 40.0000 mg | DELAYED_RELEASE_TABLET | Freq: Every day | ORAL | 0 refills | Status: DC

## 2016-03-20 NOTE — Telephone Encounter (Signed)
Left message for patient to call back. Will await her call back or it can be discussed at her next visit per Dr

## 2016-03-20 NOTE — Telephone Encounter (Signed)
Called patient and left message for them to return call at the office   

## 2016-03-20 NOTE — Telephone Encounter (Signed)
Patient aware and she will call back to get it scheduled when she is feeling better from her operation

## 2016-03-27 ENCOUNTER — Ambulatory Visit (HOSPITAL_COMMUNITY): Payer: Self-pay | Admitting: Psychiatry

## 2016-03-27 ENCOUNTER — Telehealth: Payer: Self-pay | Admitting: Family Medicine

## 2016-03-27 MED ORDER — OXYCODONE-ACETAMINOPHEN 5-325 MG PO TABS: ORAL_TABLET | ORAL | 0 refills | Status: DC

## 2016-03-27 NOTE — Telephone Encounter (Signed)
Patient said she had surgery at [facility] and was just released.  She said she was supposed to get a prescription for oxycodone 5mg , but she got home and they do not have it.  She is wanting to know if Dr. Nicki Reaper can just write this for her instead of her driving back to [facility].

## 2016-03-27 NOTE — Telephone Encounter (Signed)
Patient was notified that script is ready for pickup. Follow up with orthopedic if further issues.

## 2016-03-27 NOTE — Telephone Encounter (Signed)
Notes from yesterday's procedure in care everywhere. Please advise.

## 2016-03-27 NOTE — Telephone Encounter (Signed)
I am comfortable with following-Percocet 5/325 mg, #40  Take 1-2 every 4 hours when necessary severe pain. If ongoing need the patient will need to follow-up here and or follow-up with orthopedics thank you

## 2016-03-28 ENCOUNTER — Other Ambulatory Visit: Payer: Self-pay

## 2016-03-28 MED ORDER — OXYCODONE HCL 5 MG PO TABS
ORAL_TABLET | ORAL | 0 refills | Status: DC
Start: 2016-03-28 — End: 2016-06-16

## 2016-04-10 ENCOUNTER — Ambulatory Visit (HOSPITAL_COMMUNITY): Payer: Self-pay | Admitting: Psychiatry

## 2016-04-21 ENCOUNTER — Telehealth (HOSPITAL_COMMUNITY): Payer: Self-pay | Admitting: *Deleted

## 2016-04-21 NOTE — Telephone Encounter (Signed)
voice message from patient, said to cancel appointment scheduled for 04/23/16.  she has some insurance issues and will call back when she get this settled.

## 2016-04-23 ENCOUNTER — Ambulatory Visit (HOSPITAL_COMMUNITY): Payer: Self-pay | Admitting: Psychiatry

## 2016-05-07 ENCOUNTER — Other Ambulatory Visit: Payer: Self-pay | Admitting: Family Medicine

## 2016-05-08 ENCOUNTER — Other Ambulatory Visit: Payer: Self-pay | Admitting: *Deleted

## 2016-05-08 MED ORDER — PANTOPRAZOLE SODIUM 40 MG PO TBEC: DELAYED_RELEASE_TABLET | ORAL | 0 refills | Status: DC

## 2016-05-09 ENCOUNTER — Other Ambulatory Visit: Payer: Self-pay | Admitting: *Deleted

## 2016-05-09 MED ORDER — PANTOPRAZOLE SODIUM 40 MG PO TBEC: DELAYED_RELEASE_TABLET | ORAL | 0 refills | Status: DC

## 2016-06-16 ENCOUNTER — Encounter: Payer: Self-pay | Admitting: Family Medicine

## 2016-06-16 ENCOUNTER — Ambulatory Visit (INDEPENDENT_AMBULATORY_CARE_PROVIDER_SITE_OTHER): Admitting: Family Medicine

## 2016-06-16 VITALS — BP 148/86 | Ht 66.0 in

## 2016-06-16 DIAGNOSIS — Z13220 Encounter for screening for lipoid disorders: Secondary | ICD-10-CM | POA: Diagnosis not present

## 2016-06-16 DIAGNOSIS — R21 Rash and other nonspecific skin eruption: Secondary | ICD-10-CM

## 2016-06-16 DIAGNOSIS — K219 Gastro-esophageal reflux disease without esophagitis: Secondary | ICD-10-CM

## 2016-06-16 DIAGNOSIS — Z79899 Other long term (current) drug therapy: Secondary | ICD-10-CM

## 2016-06-16 DIAGNOSIS — F17200 Nicotine dependence, unspecified, uncomplicated: Secondary | ICD-10-CM

## 2016-06-16 DIAGNOSIS — F320 Major depressive disorder, single episode, mild: Secondary | ICD-10-CM

## 2016-06-16 MED ORDER — MOMETASONE FUROATE 0.1 % EX CREA: TOPICAL_CREAM | CUTANEOUS | 3 refills | Status: DC

## 2016-06-16 MED ORDER — BUPROPION HCL ER (SR) 150 MG PO TB12: 150.0000 mg | ORAL_TABLET | Freq: Two times a day (BID) | ORAL | 2 refills | Status: DC

## 2016-06-16 MED ORDER — PANTOPRAZOLE SODIUM 40 MG PO TBEC: DELAYED_RELEASE_TABLET | ORAL | 3 refills | Status: DC

## 2016-06-16 NOTE — Progress Notes (Signed)
Spoke with patient and informed her that Dr.Scott did send in prescription for rash to [facility].

## 2016-06-16 NOTE — Progress Notes (Signed)
   Subjective:    Patient ID: Brittany Blankenship, female    DOB: 04-17-1955, 61 y.o.   MRN: [id number]  Depression         This is a chronic (follow up) problem.  reoccuring rash for months. Comes up in different spots, arms, back, stomach, groin area. Itchy. Started several months ago.   Review of Systems  Psychiatric/Behavioral: Positive for depression.   Patient states she gets whitecoat hypertension. Relates no chest tightness pressure pain shortness breath she does smoke she notes she needs to quit  She states depression is been doing good lately her moods are doing good. Not feeling hopeless or saddle blue but does feel she needs a medication she would like to try Wellbutrin to help her quit smoking. Discussion held regarding benefits    Objective:   Physical Exam  Lungs clear heart regular pulse normal BP mild elevation of systolic Patient has a nonspecific rash right upper abdomen looks like several balms that she's been itching she states it comes and goes over various places of the abdomen and sometimes back and arms she thought it might be related to Zoloft she wasn't sure     Assessment & Plan:  Depression doing better but she is requesting a switch from Zoloft to Wellbutrin she states that her husband uses Wellbutrin helped his depression as well as help him quit smoking she is motivated to quit smoking  She does have reflux issues not as bad as what they have been she is try to cut back on PPI because of concern for osteoporosis and recent foot fracture  Foot fractured followed by specialist  Smoking patient was counseled to quit smoking  [place] hypertension she states blood pressure is good outside the office  Cholesterol profile kidney function profile ordered  Nonspecific rash try cream if ongoing troubles notify us  Follow-up 6 months

## 2016-06-21 LAB — BASIC METABOLIC PANEL
BUN / CREAT RATIO: 12 (ref 12–28)
BUN: 10 mg/dL (ref 8–27)
CO2: 25 mmol/L (ref 18–29)
CREATININE: 0.82 mg/dL (ref 0.57–1.00)
Calcium: 9.9 mg/dL (ref 8.7–10.3)
Chloride: 101 mmol/L (ref 96–106)
GFR, EST AFRICAN AMERICAN: 89 mL/min/{1.73_m2} (ref >59)
GFR, EST NON AFRICAN AMERICAN: 77 mL/min/{1.73_m2} (ref >59)
GLUCOSE: 106 mg/dL — AB (ref 65–99)
Potassium: 4.7 mmol/L (ref 3.5–5.2)
SODIUM: 144 mmol/L (ref 134–144)

## 2016-06-21 LAB — LIPID PANEL
CHOLESTEROL TOTAL: 218 mg/dL — AB (ref 100–199)
Chol/HDL Ratio: 3.5 ratio (ref 0.0–4.4)
HDL: 63 mg/dL (ref >39)
LDL Calculated: 132 mg/dL — ABNORMAL HIGH (ref 0–99)
TRIGLYCERIDES: 117 mg/dL (ref 0–149)
VLDL CHOLESTEROL CAL: 23 mg/dL (ref 5–40)

## 2016-06-22 ENCOUNTER — Encounter: Payer: Self-pay | Admitting: Family Medicine

## 2016-08-12 ENCOUNTER — Other Ambulatory Visit: Payer: Self-pay | Admitting: *Deleted

## 2016-08-12 MED ORDER — BUPROPION HCL ER (SR) 150 MG PO TB12: 150.0000 mg | ORAL_TABLET | Freq: Two times a day (BID) | ORAL | 1 refills | Status: DC

## 2016-10-16 ENCOUNTER — Ambulatory Visit (INDEPENDENT_AMBULATORY_CARE_PROVIDER_SITE_OTHER): Admitting: Family Medicine

## 2016-10-16 ENCOUNTER — Encounter: Payer: Self-pay | Admitting: Family Medicine

## 2016-10-16 VITALS — BP 148/88 | Temp 98.3°F | Ht 66.0 in | Wt 143.0 lb

## 2016-10-16 DIAGNOSIS — Z1211 Encounter for screening for malignant neoplasm of colon: Secondary | ICD-10-CM

## 2016-10-16 DIAGNOSIS — R140 Abdominal distension (gaseous): Secondary | ICD-10-CM | POA: Diagnosis not present

## 2016-10-16 DIAGNOSIS — R21 Rash and other nonspecific skin eruption: Secondary | ICD-10-CM

## 2016-10-16 DIAGNOSIS — R5383 Other fatigue: Secondary | ICD-10-CM | POA: Diagnosis not present

## 2016-10-16 MED ORDER — PERMETHRIN 5 % EX CREA: 1.0000 "application " | TOPICAL_CREAM | Freq: Once | CUTANEOUS | 0 refills | Status: AC

## 2016-10-16 NOTE — Progress Notes (Signed)
   Subjective:    Patient ID: Brittany Blankenship, female    DOB: 1956/02/04, 61 y.o.   MRN: [id number]  Rash  This is a recurrent problem. Episode onset: months. Location: rash moves around to different areas. currently on abdomen and back. The rash is characterized by itchiness.   Bumps on back of neck that come and go. Itches intensely  Fatigue all the time.   Swelling in abdomen. Having more frequent stools.  Relates she feels swollen in the abdomen poor appetite unable to eat much low energy symptoms been present over the past several months progressively worse   Review of Systems  Skin: Positive for rash.   Relates fatigue tiredness denies headaches nausea vomiting    Objective:   Physical Exam Abdomen soft somewhat protruding aspects to it. Papular rash noted on the lower back upper neck some linear aspects Lungs clear hearts regular Extremities no edema skin warm dry       Assessment & Plan:  Rash-referral to dermatology if it does not clear up with Elimite  Abdominal bloating has had hysterectomy need to rule out ovarian tumor because of abdominal bloating fatigue tiredness and early satiety  Patient is in need of screening colonoscopy.  Fatigue lab work ordered await results

## 2016-10-17 LAB — BASIC METABOLIC PANEL
BUN/Creatinine Ratio: 10 — ABNORMAL LOW (ref 12–28)
BUN: 9 mg/dL (ref 8–27)
CALCIUM: 9.9 mg/dL (ref 8.7–10.3)
CO2: 27 mmol/L (ref 20–29)
Chloride: 102 mmol/L (ref 96–106)
Creatinine, Ser: 0.88 mg/dL (ref 0.57–1.00)
GFR, EST AFRICAN AMERICAN: 82 mL/min/{1.73_m2} (ref >59)
GFR, EST NON AFRICAN AMERICAN: 71 mL/min/{1.73_m2} (ref >59)
Glucose: 75 mg/dL (ref 65–99)
POTASSIUM: 3.9 mmol/L (ref 3.5–5.2)
Sodium: 143 mmol/L (ref 134–144)

## 2016-10-17 LAB — CBC WITH DIFFERENTIAL/PLATELET
BASOS: 0 %
Basophils Absolute: 0 10*3/uL (ref 0.0–0.2)
EOS (ABSOLUTE): 0.4 10*3/uL (ref 0.0–0.4)
EOS: 4 %
HEMOGLOBIN: 14.5 g/dL (ref 11.1–15.9)
Hematocrit: 44 % (ref 34.0–46.6)
IMMATURE GRANS (ABS): 0 10*3/uL (ref 0.0–0.1)
Immature Granulocytes: 0 %
LYMPHS ABS: 3.5 10*3/uL — AB (ref 0.7–3.1)
Lymphs: 36 %
MCH: 29.6 pg (ref 26.6–33.0)
MCHC: 33 g/dL (ref 31.5–35.7)
MCV: 90 fL (ref 79–97)
MONOCYTES: 6 %
Monocytes Absolute: 0.6 10*3/uL (ref 0.1–0.9)
NEUTROS ABS: 5.3 10*3/uL (ref 1.4–7.0)
Neutrophils: 54 %
Platelets: 407 10*3/uL — ABNORMAL HIGH (ref 150–379)
RBC: 4.9 x10E6/uL (ref 3.77–5.28)
RDW: 13 % (ref 12.3–15.4)
WBC: 9.8 10*3/uL (ref 3.4–10.8)

## 2016-10-17 LAB — HEPATIC FUNCTION PANEL
ALT: 20 IU/L (ref 0–32)
AST: 23 IU/L (ref 0–40)
Albumin: 4.8 g/dL (ref 3.6–4.8)
Alkaline Phosphatase: 133 IU/L — ABNORMAL HIGH (ref 39–117)
BILIRUBIN, DIRECT: 0.07 mg/dL (ref 0.00–0.40)
Total Protein: 7.2 g/dL (ref 6.0–8.5)

## 2016-10-17 LAB — TSH: TSH: 1.72 u[IU]/mL (ref 0.450–4.500)

## 2016-10-21 ENCOUNTER — Encounter: Payer: Self-pay | Admitting: Family Medicine

## 2016-10-21 ENCOUNTER — Encounter (INDEPENDENT_AMBULATORY_CARE_PROVIDER_SITE_OTHER): Payer: Self-pay | Admitting: *Deleted

## 2016-10-24 ENCOUNTER — Ambulatory Visit (HOSPITAL_COMMUNITY)
Admission: RE | Admit: 2016-10-24 | Discharge: 2016-10-24 | Disposition: A | Discharge status: Home / Self Care | Source: Ambulatory Visit | Attending: Family Medicine | Admitting: Family Medicine

## 2016-10-24 DIAGNOSIS — Z90710 Acquired absence of both cervix and uterus: Secondary | ICD-10-CM | POA: Insufficient documentation

## 2016-10-24 DIAGNOSIS — R140 Abdominal distension (gaseous): Secondary | ICD-10-CM | POA: Diagnosis not present

## 2016-11-04 ENCOUNTER — Telehealth: Payer: Self-pay | Admitting: Family Medicine

## 2016-11-04 DIAGNOSIS — R21 Rash and other nonspecific skin eruption: Secondary | ICD-10-CM

## 2016-11-04 DIAGNOSIS — Z1211 Encounter for screening for malignant neoplasm of colon: Secondary | ICD-10-CM

## 2016-11-04 NOTE — Telephone Encounter (Signed)
I recommend setting up a colonoscopy in [facility] either with [facility] gastroenterology [place] whoever can get her in to get a colonoscopy this year

## 2016-11-04 NOTE — Telephone Encounter (Signed)
Referral put in for colonoscopy. Tried to call to let pt know number busy. Also to ask about derm referral.

## 2016-11-04 NOTE — Telephone Encounter (Signed)
Pt is needing colonoscopy scheduled and rehman is backed up till after the first of the year. Pt is requesting a referral to someone who can get her in before the end of the year. Pt is also requesting a referral to dermatology.

## 2016-11-10 ENCOUNTER — Encounter: Payer: Self-pay | Admitting: Family Medicine

## 2016-11-10 NOTE — Telephone Encounter (Signed)
Patient wants to see dermatology due to reoccurring rash-referral ordered in EPIC.

## 2016-11-10 NOTE — Telephone Encounter (Signed)
Patient wants to cancel referral to new GI doctor and wants to stick with Dr Laural Golden.

## 2016-11-12 ENCOUNTER — Encounter: Payer: Self-pay | Admitting: Family Medicine

## 2016-12-16 ENCOUNTER — Ambulatory Visit (INDEPENDENT_AMBULATORY_CARE_PROVIDER_SITE_OTHER): Admitting: Family Medicine

## 2016-12-16 ENCOUNTER — Encounter: Payer: Self-pay | Admitting: Family Medicine

## 2016-12-16 VITALS — BP 140/86 | Ht 66.0 in | Wt 142.4 lb

## 2016-12-16 DIAGNOSIS — R140 Abdominal distension (gaseous): Secondary | ICD-10-CM | POA: Diagnosis not present

## 2016-12-16 DIAGNOSIS — Z23 Encounter for immunization: Secondary | ICD-10-CM

## 2016-12-16 DIAGNOSIS — Z780 Asymptomatic menopausal state: Secondary | ICD-10-CM

## 2016-12-16 NOTE — Progress Notes (Signed)
   Subjective:    Patient ID: Brittany Blankenship, female    DOB: Nov 03, 1955, 61 y.o.   MRN: [id number]  HPI Patient arrives for a follow up on rash and abdominal bloating. She relates intermittent epigastric pain intermittent abdominal pain she denies weight loss no fever sweats or chills she relates multiple frequent stools per day despite trying to change her diet she relates no blood or mucus in the stool denies sweats chills fevers. Denies blood in the stool. In addition to this she states her appetite is good. Recent lab work overall looks good.     Patient states she did see dermatology and she gave her extra strength Elimite and ha her do it 3 times but it has not helped. Patient stated she is still bloated and tired. She has reoccurring rashes  Review of Systems Please see above denies headaches she denies difficulty chewing or swallowing denies chest tightness pressure pain shortness of breath denies heart racing denies vomiting does relate bowel movements denies rectal bleeding denies swelling in the legs    Objective:   Physical Exam Neck no masses no adenopathy lungs clear no crackles respiratory rate normal heart regular no murmurs no gallops abdomen is soft no guarding rebound there is some slight epigastric tenderness       Assessment & Plan:  Repetitive abdominal bloating with no rectal bleeding but has frequent bowel movements also has epigastric pain and discomfort initial workup negative I believe patient would benefit from seeing gastroenterology more than likely will need colonoscopy her last colonoscopy was 2007 patient also may need endoscopy. Referral to [facility] gastroenterology  Reoccurring rash she will follow-up with dermatology  Patient counseled to quit smoking  Bone density ordered

## 2016-12-19 ENCOUNTER — Encounter: Payer: Self-pay | Admitting: Family Medicine

## 2016-12-22 ENCOUNTER — Other Ambulatory Visit (HOSPITAL_COMMUNITY): Payer: Self-pay

## 2017-01-05 ENCOUNTER — Other Ambulatory Visit (HOSPITAL_COMMUNITY): Payer: Self-pay

## 2018-01-11 ENCOUNTER — Encounter: Payer: Self-pay | Admitting: Family Medicine

## 2018-01-11 ENCOUNTER — Ambulatory Visit (INDEPENDENT_AMBULATORY_CARE_PROVIDER_SITE_OTHER): Admitting: Family Medicine

## 2018-01-11 VITALS — Temp 97.6°F | Ht 66.0 in | Wt 137.2 lb

## 2018-01-11 DIAGNOSIS — J329 Chronic sinusitis, unspecified: Secondary | ICD-10-CM | POA: Diagnosis not present

## 2018-01-11 DIAGNOSIS — J310 Chronic rhinitis: Secondary | ICD-10-CM

## 2018-01-11 MED ORDER — AMOXICILLIN-POT CLAVULANATE 875-125 MG PO TABS: 1.0000 | ORAL_TABLET | Freq: Two times a day (BID) | ORAL | 0 refills | Status: DC

## 2018-01-11 NOTE — Progress Notes (Signed)
   Subjective:    Patient ID: Brittany Blankenship, female    DOB: 09-Jun-1955, 62 y.o.   MRN: [id number]  Cough  This is a new problem. The current episode started 1 to 4 weeks ago. The cough is productive of sputum. Associated symptoms include ear pain, a fever, headaches, rhinorrhea and a sore throat. Pertinent negatives include no chest pain, shortness of breath or wheezing. Associated symptoms comments: Diarrhea, fatigue. Treatments tried: Mucinex DM, cough drops, on guard pills-doterra. The treatment provided mild relief.   Intermittent low-grade fevers body aches hot and cold at times headache chest congestion coughing sinus pressure pain discomfort present over the past few weeks   Review of Systems  Constitutional: Positive for fever. Negative for activity change.  HENT: Positive for congestion, ear pain, rhinorrhea and sore throat.   Eyes: Negative for discharge.  Respiratory: Positive for cough. Negative for shortness of breath and wheezing.   Cardiovascular: Negative for chest pain.  Neurological: Positive for headaches.       Objective:   Physical Exam  Constitutional: She appears well-developed.  HENT:  Head: Normocephalic.  Nose: Nose normal.  Mouth/Throat: Oropharynx is clear and moist. No oropharyngeal exudate.  Neck: Neck supple.  Cardiovascular: Normal rate and normal heart sounds.  No murmur heard. Pulmonary/Chest: Effort normal and breath sounds normal. She has no wheezes.  Lymphadenopathy:    She has no cervical adenopathy.  Skin: Skin is warm and dry.  Nursing note and vitals reviewed.  Moderate sinus tenderness       Assessment & Plan:  No sign of pneumonia Sinusitis Underlying virus Warning signs discussed Follow-up if progressive troubles Augmentin twice daily for 10 days call if any side effects or problems

## 2018-01-21 ENCOUNTER — Telehealth: Payer: Self-pay | Admitting: Family Medicine

## 2018-01-21 MED ORDER — DOXYCYCLINE HYCLATE 100 MG PO TABS: 100.0000 mg | ORAL_TABLET | Freq: Two times a day (BID) | ORAL | 0 refills | Status: DC

## 2018-01-21 NOTE — Telephone Encounter (Signed)
I recommend a multiple course of antibiotics Doxycycline 100 mg 1 twice daily 10 days Take with a snack in a tall glass of water

## 2018-01-21 NOTE — Telephone Encounter (Signed)
Prescription sent electronically to pharmacy. Left message to return call to notify patient. 

## 2018-01-21 NOTE — Telephone Encounter (Signed)
Patient was seen 01/11/18 and stating today still has congestion and cough finishing up her last antibiotic today. [facility]

## 2018-01-21 NOTE — Telephone Encounter (Signed)
Patient notified

## 2018-01-21 NOTE — Telephone Encounter (Signed)
Please advise. Thank you

## 2019-09-09 ENCOUNTER — Other Ambulatory Visit: Payer: Self-pay

## 2019-09-09 ENCOUNTER — Telehealth: Payer: Self-pay | Admitting: Nurse Practitioner

## 2019-09-09 ENCOUNTER — Ambulatory Visit (HOSPITAL_COMMUNITY)
Admission: RE | Admit: 2019-09-09 | Discharge: 2019-09-09 | Disposition: A | Discharge status: Home / Self Care | Source: Ambulatory Visit | Attending: Family Medicine | Admitting: Family Medicine

## 2019-09-09 ENCOUNTER — Encounter: Payer: Self-pay | Admitting: Family Medicine

## 2019-09-09 ENCOUNTER — Ambulatory Visit (INDEPENDENT_AMBULATORY_CARE_PROVIDER_SITE_OTHER): Admitting: Family Medicine

## 2019-09-09 VITALS — BP 132/82 | HR 91 | Temp 97.5°F | Ht 66.0 in | Wt 139.2 lb

## 2019-09-09 DIAGNOSIS — M25561 Pain in right knee: Secondary | ICD-10-CM

## 2019-09-09 NOTE — Telephone Encounter (Signed)
Patient is having a physical on 7/23 and needing labs done

## 2019-09-09 NOTE — Progress Notes (Signed)
Patient ID: Brittany Blankenship, female    DOB: 1955/07/21, 64 y.o.   MRN: [id number]   Chief Complaint  Patient presents with  . right knee swollen and weak for several weeks    Patient states she feels like she has fluid on her knee   Subjective:    HPI Pt on rt knee noticing swelling and pain in lateral side of knee.  Feels weaker than the left.  Noticing this over past 2-3 wks. Has surgery on bilateral feet over years.  Let foot giving more pain, and wondering if might of caused rt knee pain. Unable to put weight on it at times. Using ice, elevation, and antiinflammatories, has noticed some improvement with this. Achiness in the knee.  Taking 600mg  ibuprofen at a time for pain. No direct trauma or injury.  Not wanting anything stronger for pain. Might surgery again on left foot. Was getting some shots of cortisone in it.    Medical History Brittany Blankenship has a past medical history of GERD (gastroesophageal reflux disease), Osteoporosis, and Vocal cord polyps.   Outpatient Encounter Medications as of 09/09/2019  Medication Sig  . acetaminophen (TYLENOL) 325 MG tablet Take 650 mg by mouth every 6 (six) hours as needed. (Patient not taking: Reported on 09/09/2019)  . buPROPion (WELLBUTRIN SR) 150 MG 12 hr tablet Take 1 tablet (150 mg total) by mouth 2 (two) times daily. (Patient not taking: Reported on 09/09/2019)  . Calcium Carbonate (CALTRATE 600 PO) Take by mouth. (Patient not taking: Reported on 09/09/2019)  . doxycycline (VIBRA-TABS) 100 MG tablet Take 1 tablet (100 mg total) by mouth 2 (two) times daily. Take with a snack and a tall glass of water (Patient not taking: Reported on 09/09/2019)  . mometasone (ELOCON) 0.1 % cream Upright thin amount twice a day when necessary rash (Patient not taking: Reported on 09/09/2019)  . Multiple Vitamin (MULTIVITAMIN) tablet Take 1 tablet by mouth daily.  . pantoprazole (PROTONIX) 40 MG tablet TAKE 1 TABLET DAILY (NEED OFFICE VISIT) (Patient not taking:  Reported on 09/09/2019)   No facility-administered encounter medications on file as of 09/09/2019.     Review of Systems  Constitutional: Negative for chills and fever.  Musculoskeletal: Positive for arthralgias (rt knee pain.) and joint swelling. Negative for back pain.  Skin: Negative for rash.     Vitals BP 132/82   Pulse 91   Temp (!) 97.5 F (36.4 C) (Oral)   Ht 5\' 6"  (1.676 m)   Wt 139 lb 3.2 oz (63.1 kg)   SpO2 99%   BMI 22.47 kg/m   Objective:   Physical Exam Vitals and nursing note reviewed.  Constitutional:      General: She is not in acute distress.    Appearance: Normal appearance. She is not ill-appearing.  Musculoskeletal:        General: Normal range of motion.     Comments: +crepitus in rt knee.  Normal rom. Neg lachman's. No erythema or warmth.  Lateral swelling.   Normal rom on left knee.   Neurological:     General: No focal deficit present.     Mental Status: She is alert.     Cranial Nerves: No cranial nerve deficit.     Motor: No weakness.     Gait: Gait normal.  Psychiatric:        Mood and Affect: Mood normal.        Behavior: Behavior normal.      Assessment and Plan  1. Right anterior knee pain - DG Knee Complete 4 Views Right; Future   Pt to continue with 600mg  ibuprofen tid with food.  Can add 500mg  tylenol q6hrs. Also ice, rest, elevation. Pt declining anything stronger for pain at this time. Xray rt knee orderd.   F/u prn.

## 2019-09-09 NOTE — Telephone Encounter (Signed)
Last labs 10/16/16: liver, met 7, tsh and cbc

## 2019-09-12 ENCOUNTER — Other Ambulatory Visit: Payer: Self-pay | Admitting: *Deleted

## 2019-09-12 DIAGNOSIS — R5383 Other fatigue: Secondary | ICD-10-CM

## 2019-09-12 DIAGNOSIS — M779 Enthesopathy, unspecified: Secondary | ICD-10-CM

## 2019-09-12 DIAGNOSIS — M1990 Unspecified osteoarthritis, unspecified site: Secondary | ICD-10-CM

## 2019-09-12 DIAGNOSIS — Z79899 Other long term (current) drug therapy: Secondary | ICD-10-CM

## 2019-09-12 DIAGNOSIS — Z1321 Encounter for screening for nutritional disorder: Secondary | ICD-10-CM

## 2019-09-12 DIAGNOSIS — Z13220 Encounter for screening for lipoid disorders: Secondary | ICD-10-CM

## 2019-09-12 NOTE — Telephone Encounter (Signed)
Please order: CBC with diff Met 7 Lipid  Liver Vit D

## 2019-09-12 NOTE — Telephone Encounter (Signed)
Labs ordered, attempted to contact pt.

## 2019-09-21 ENCOUNTER — Encounter: Payer: Self-pay | Admitting: Family Medicine

## 2019-09-30 LAB — LIPID PANEL
Chol/HDL Ratio: 3.9 ratio (ref 0.0–4.4)
Cholesterol, Total: 234 mg/dL — ABNORMAL HIGH (ref 100–199)
HDL: 60 mg/dL (ref >39)
LDL Chol Calc (NIH): 157 mg/dL — ABNORMAL HIGH (ref 0–99)
Triglycerides: 99 mg/dL (ref 0–149)
VLDL Cholesterol Cal: 17 mg/dL (ref 5–40)

## 2019-09-30 LAB — CBC WITH DIFFERENTIAL/PLATELET
Basophils Absolute: 0.1 10*3/uL (ref 0.0–0.2)
Basos: 1 %
EOS (ABSOLUTE): 0.3 10*3/uL (ref 0.0–0.4)
Eos: 3 %
Hematocrit: 43.2 % (ref 34.0–46.6)
Hemoglobin: 14.5 g/dL (ref 11.1–15.9)
Immature Grans (Abs): 0 10*3/uL (ref 0.0–0.1)
Immature Granulocytes: 0 %
Lymphocytes Absolute: 2.4 10*3/uL (ref 0.7–3.1)
Lymphs: 27 %
MCH: 30.9 pg (ref 26.6–33.0)
MCHC: 33.6 g/dL (ref 31.5–35.7)
MCV: 92 fL (ref 79–97)
Monocytes Absolute: 0.5 10*3/uL (ref 0.1–0.9)
Monocytes: 6 %
Neutrophils Absolute: 5.5 10*3/uL (ref 1.4–7.0)
Neutrophils: 63 %
Platelets: 370 10*3/uL (ref 150–450)
RBC: 4.7 x10E6/uL (ref 3.77–5.28)
RDW: 11.7 % (ref 11.7–15.4)
WBC: 8.8 10*3/uL (ref 3.4–10.8)

## 2019-09-30 LAB — BASIC METABOLIC PANEL
BUN/Creatinine Ratio: 7 — ABNORMAL LOW (ref 12–28)
BUN: 6 mg/dL — ABNORMAL LOW (ref 8–27)
CO2: 23 mmol/L (ref 20–29)
Calcium: 9.6 mg/dL (ref 8.7–10.3)
Chloride: 98 mmol/L (ref 96–106)
Creatinine, Ser: 0.85 mg/dL (ref 0.57–1.00)
GFR calc Af Amer: 84 mL/min/{1.73_m2} (ref >59)
GFR calc non Af Amer: 73 mL/min/{1.73_m2} (ref >59)
Glucose: 142 mg/dL — ABNORMAL HIGH (ref 65–99)
Potassium: 4.6 mmol/L (ref 3.5–5.2)
Sodium: 137 mmol/L (ref 134–144)

## 2019-09-30 LAB — HEPATIC FUNCTION PANEL
ALT: 11 IU/L (ref 0–32)
AST: 18 IU/L (ref 0–40)
Albumin: 4.6 g/dL (ref 3.8–4.8)
Alkaline Phosphatase: 135 IU/L — ABNORMAL HIGH (ref 48–121)
Bilirubin Total: 0.3 mg/dL (ref 0.0–1.2)
Bilirubin, Direct: 0.08 mg/dL (ref 0.00–0.40)
Total Protein: 6.8 g/dL (ref 6.0–8.5)

## 2019-09-30 LAB — VITAMIN D 25 HYDROXY (VIT D DEFICIENCY, FRACTURES): Vit D, 25-Hydroxy: 29.5 ng/mL — ABNORMAL LOW (ref 30.0–100.0)

## 2019-10-01 ENCOUNTER — Other Ambulatory Visit: Payer: Self-pay | Admitting: Nurse Practitioner

## 2019-10-07 ENCOUNTER — Other Ambulatory Visit: Payer: Self-pay

## 2019-10-07 ENCOUNTER — Ambulatory Visit (INDEPENDENT_AMBULATORY_CARE_PROVIDER_SITE_OTHER): Admitting: Nurse Practitioner

## 2019-10-07 ENCOUNTER — Encounter: Payer: Self-pay | Admitting: Nurse Practitioner

## 2019-10-07 VITALS — BP 168/94 | Temp 97.6°F | Wt 136.6 lb

## 2019-10-07 DIAGNOSIS — E559 Vitamin D deficiency, unspecified: Secondary | ICD-10-CM

## 2019-10-07 DIAGNOSIS — Z0000 Encounter for general adult medical examination without abnormal findings: Secondary | ICD-10-CM | POA: Diagnosis not present

## 2019-10-07 DIAGNOSIS — F17200 Nicotine dependence, unspecified, uncomplicated: Secondary | ICD-10-CM | POA: Diagnosis not present

## 2019-10-07 NOTE — Progress Notes (Signed)
Subjective:    Patient ID: Brittany Blankenship, female    DOB: [birth year], 64 y.o.   MRN: [id number]  HPI The patient comes in today for a wellness visit.    A review of their health history was completed.  A review of medications was also completed.  Any needed refills; none  Eating habits:salads and  Vegetables.  Tries to avoid/limit pasta, pizza, spicy, greasy foods, limits milk and cheese   Falls/  MVA accidents in past few months: none  Regular exercise: difficult but tries to do some  Specialist pt sees on regular basis: ortho  Preventative health issues were discussed.   Pt is married x 38 years, Not sexually active at this time.     Review of Systems  Constitutional: Negative for activity change, appetite change, fatigue and fever.  Respiratory: Negative for cough, chest tightness, shortness of breath and wheezing.   Cardiovascular: Negative for chest pain and leg swelling.  Gastrointestinal: Negative for abdominal pain, blood in stool, constipation, diarrhea, nausea and vomiting.  Genitourinary: Negative for difficulty urinating, dysuria, frequency, genital sores, pelvic pain, urgency, vaginal bleeding, vaginal discharge and vaginal pain.  Psychiatric/Behavioral: Negative for suicidal ideas. The patient is not nervous/anxious.        Objective:   Physical Exam Vitals reviewed.  Constitutional:      Appearance: Normal appearance. She is normal weight.  Cardiovascular:     Rate and Rhythm: Normal rate and regular rhythm.     Pulses: Normal pulses.     Heart sounds: No murmur heard.  No friction rub. No gallop.   Pulmonary:     Effort: Pulmonary effort is normal. No respiratory distress.     Breath sounds: Normal breath sounds. No stridor. No wheezing, rhonchi or rales.  Abdominal:     Palpations: Abdomen is soft. There is no mass.     Tenderness: There is no abdominal tenderness.  Skin:    General: Skin is warm and dry.  Neurological:     Mental Status:  She is alert.  Psychiatric:        Mood and Affect: Mood normal.        Behavior: Behavior normal.        Thought Content: Thought content normal.        Judgment: Judgment normal.   Defers GU and breast exams until her physical this fall.    Today's Vitals   10/07/19 1112  BP: (!) 168/94  Temp: 97.6 F (36.4 C)  Weight: 62 kg   Body mass index is 22.05 kg/m. Recent Results (from the past 2160 hour(s))  Basic metabolic panel     Status: Abnormal   Collection Time: 09/29/19 11:07 AM  Result Value Ref Range   Glucose 142 (H) 65 - 99 mg/dL   BUN 6 (L) 8 - 27 mg/dL   Creatinine, Ser 0.85 0.57 - 1.00 mg/dL   GFR calc non Af Amer 73 >59 mL/min/1.73   GFR calc Af Amer 84 >59 mL/min/1.73    Comment: **[facility] currently reports eGFR in compliance with the current**   recommendations of the [employer]. [facility] will   update reporting as new guidelines are published from the NKF-ASN   Task force.    BUN/Creatinine Ratio 7 (L) 12 - 28   Sodium 137 134 - 144 mmol/L   Potassium 4.6 3.5 - 5.2 mmol/L   Chloride 98 96 - 106 mmol/L   CO2 23 20 - 29 mmol/L   Calcium 9.6  8.7 - 10.3 mg/dL  CBC with Differential/Platelet     Status: None   Collection Time: 09/29/19 11:07 AM  Result Value Ref Range   WBC 8.8 3.4 - 10.8 x10E3/uL   RBC 4.70 3.77 - 5.28 x10E6/uL   Hemoglobin 14.5 11.1 - 15.9 g/dL   Hematocrit 43.2 34.0 - 46.6 %   MCV 92 79 - 97 fL   MCH 30.9 26.6 - 33.0 pg   MCHC 33.6 31 - 35 g/dL   RDW 11.7 11.7 - 15.4 %   Platelets 370 150 - 450 x10E3/uL   Neutrophils 63 Not Estab. %   Lymphs 27 Not Estab. %   Monocytes 6 Not Estab. %   Eos 3 Not Estab. %   Basos 1 Not Estab. %   Neutrophils Absolute 5.5 1 - 7 x10E3/uL   Lymphocytes Absolute 2.4 0 - 3 x10E3/uL   Monocytes Absolute 0.5 0 - 0 x10E3/uL   EOS (ABSOLUTE) 0.3 0.0 - 0.4 x10E3/uL   Basophils Absolute 0.1 0 - 0 x10E3/uL   Immature Granulocytes 0 Not Estab. %   Immature Grans (Abs) 0.0 0.0 - 0.1 x10E3/uL    Lipid panel     Status: Abnormal   Collection Time: 09/29/19 11:07 AM  Result Value Ref Range   Cholesterol, Total 234 (H) 100 - 199 mg/dL   Triglycerides 99 0 - 149 mg/dL   HDL 60 >39 mg/dL   VLDL Cholesterol Cal 17 5 - 40 mg/dL   LDL Chol Calc (NIH) 157 (H) 0 - 99 mg/dL   Chol/HDL Ratio 3.9 0.0 - 4.4 ratio    Comment:                                   T. Chol/HDL Ratio                                             Men  Women                               1/2 Avg.Risk  3.4    3.3                                   Avg.Risk  5.0    4.4                                2X Avg.Risk  9.6    7.1                                3X Avg.Risk 23.4   11.0   Hepatic function panel     Status: Abnormal   Collection Time: 09/29/19 11:07 AM  Result Value Ref Range   Total Protein 6.8 6.0 - 8.5 g/dL   Albumin 4.6 3.8 - 4.8 g/dL   Bilirubin Total 0.3 0.0 - 1.2 mg/dL   Bilirubin, Direct 0.08 0.00 - 0.40 mg/dL   Alkaline Phosphatase 135 (H) 48 - 121 IU/L   AST 18 0 - 40 IU/L   ALT 11 0 - 32  IU/L  VITAMIN D 25 Hydroxy (Vit-D Deficiency, Fractures)     Status: Abnormal   Collection Time: 09/29/19 11:07 AM  Result Value Ref Range   Vit D, 25-Hydroxy 29.5 (L) 30.0 - 100.0 ng/mL    Comment: Vitamin D deficiency has been defined by the [place] practice guideline as a level of serum 25-OH vitamin D less than 20 ng/mL (1,2). The Endocrine Society went on to further define vitamin D insufficiency as a level between 21 and 29 ng/mL (2). 1. IOM (Institute of Medicine). 2010. Dietary reference    intakes for calcium and D. [place]: The    [employer]. 2. Holick MF, Binkley [place], Bischoff-Ferrari HA, et al.    Evaluation, treatment, and prevention of vitamin D    deficiency: an Endocrine Society clinical practice    guideline. JCEM. 2011 Jul; 96(7):1911-30.    *NOTE LABS WERE NON FASTING     Assessment & Plan:   Problem List Items Addressed This  Visit      Other   Tobacco use disorder    Other Visit Diagnoses    Routine general medical examination at a health care facility    -  Primary   Vitamin D insufficiency          Recommend to take Vit D OTC.  Recheck BP and perform GU/breast exams this fall after she starts receiving Medicare per patient choice.  Repeat lipid and FBS with physical this fall.  Encouraged regular activity such as walking. Discussed smoking including risks.  Return in about 1 year (around 10/06/2020) for yearly exam.

## 2019-10-10 ENCOUNTER — Encounter: Payer: Self-pay | Admitting: Nurse Practitioner

## 2019-10-10 NOTE — Progress Notes (Signed)
   Subjective:    Patient ID: Brittany Blankenship, female    DOB: September 08, 1955, 64 y.o.   MRN: [id number]  HPI  See other note.   Review of Systems     Objective:   Physical Exam        Assessment & Plan:

## 2019-10-11 ENCOUNTER — Ambulatory Visit (INDEPENDENT_AMBULATORY_CARE_PROVIDER_SITE_OTHER): Admitting: Orthopedic Surgery

## 2019-10-11 ENCOUNTER — Encounter: Payer: Self-pay | Admitting: Orthopedic Surgery

## 2019-10-11 ENCOUNTER — Other Ambulatory Visit: Payer: Self-pay

## 2019-10-11 VITALS — BP 152/90 | HR 82 | Ht 66.0 in | Wt 134.0 lb

## 2019-10-11 DIAGNOSIS — M1712 Unilateral primary osteoarthritis, left knee: Secondary | ICD-10-CM | POA: Diagnosis not present

## 2019-10-11 MED ORDER — MELOXICAM 7.5 MG PO TABS: 7.5000 mg | ORAL_TABLET | Freq: Every day | ORAL | 5 refills | Status: AC

## 2019-10-11 NOTE — Progress Notes (Signed)
Patient ID: Brittany Blankenship, female   DOB: 25-Apr-1955, 64 y.o.   MRN: [id number]  Chief Complaint  Patient presents with  . Knee Pain    Right knee pain, no injury.    64 year old female with approximately 3 to 4 months of 8 out of 10 pain right knee started having some swelling in June presents after using ice and ibuprofen with no improvement  She is a smoker no diabetes or hypertension  She has had a neuroma removed from the right foot in 2016 multiple fractures left foot in 2017 which may or may not be affecting her knee  She does note giving out right knee occasionally   Review of Systems  All other systems reviewed and are negative.   Past Medical History:  Diagnosis Date  . Cervical cancer ([place])   . GERD (gastroesophageal reflux disease)   . Osteoporosis   . Vocal cord polyps     Past Surgical History:  Procedure Laterality Date  . ABDOMINAL HYSTERECTOMY     partial  . COLONOSCOPY  10/2005   polyps  . ESOPHAGOGASTRODUODENOSCOPY N/A 02/03/2013   Procedure: ESOPHAGOGASTRODUODENOSCOPY (EGD);  Surgeon: Rogene Houston, MD;  Location: [facility];  Service: Endoscopy;  Laterality: N/A;  325  . MOUTH SURGERY      PHYSICAL EXAM  BP (!) 152/90   Pulse 82   Ht 5\' 6"  (1.676 m)   Wt 134 lb (60.8 kg)   BMI 21.63 kg/m  GENERAL appearance reveals no gross abnormalities, normal development grooming and hygiene   MENTAL STATUS we note that the patient is awake alert and oriented to person place and time MOOD/AFFECT ARE NORMAL   GAIT reveals no limp in the effected limb  Ortho Exam VASC 2+ dorsalis pedis pulse normal capillary refill excellent warmth to the extremity  NEURO normal sensation and no pathologic reflexes  LYMPH deferred noncontributory  MEDICAL DECISION MAKING  A.  Encounter Diagnosis  Name Primary?  Marland Kitchen Arthritis of left knee Yes    B. DATA ANALYSED:    IMAGING: Independent interpretation of images: X-rays at the hospital show 4 views of the  knee mild 3 compartment arthritis and by mild I mean that subtle little areas of joint space narrowing mild bone spur sclerosis  Orders:   Outside records reviewed:   C. MANAGEMENT   Could be just arthritis versus meniscal tear  Recommend 4 weeks of NSAID therapy and ice and if no improvement MRI to rule out intra-articular pathology that would require surgery  Meds ordered this encounter  Medications  . meloxicam (MOBIC) 7.5 MG tablet    Sig: Take 1 tablet (7.5 mg total) by mouth daily.    Dispense:  30 tablet    Refill:  5    Chr pain/med manage/outsideindep xrays

## 2019-10-11 NOTE — Patient Instructions (Addendum)
Meds ordered this encounter  Medications  . meloxicam (MOBIC) 7.5 MG tablet    Sig: Take 1 tablet (7.5 mg total) by mouth daily.    Dispense:  30 tablet    Refill:  5   Ice as needed

## 2019-11-08 ENCOUNTER — Ambulatory Visit: Admitting: Orthopedic Surgery

## 2022-01-27 IMAGING — DX DG KNEE COMPLETE 4+V*R*
4 series · 4 of 4 positions shown · non-contrast
Comparison: None.

CLINICAL DATA: Right knee pain and swelling

EXAM:
RIGHT KNEE - COMPLETE 4+ VIEW

[knee ap]
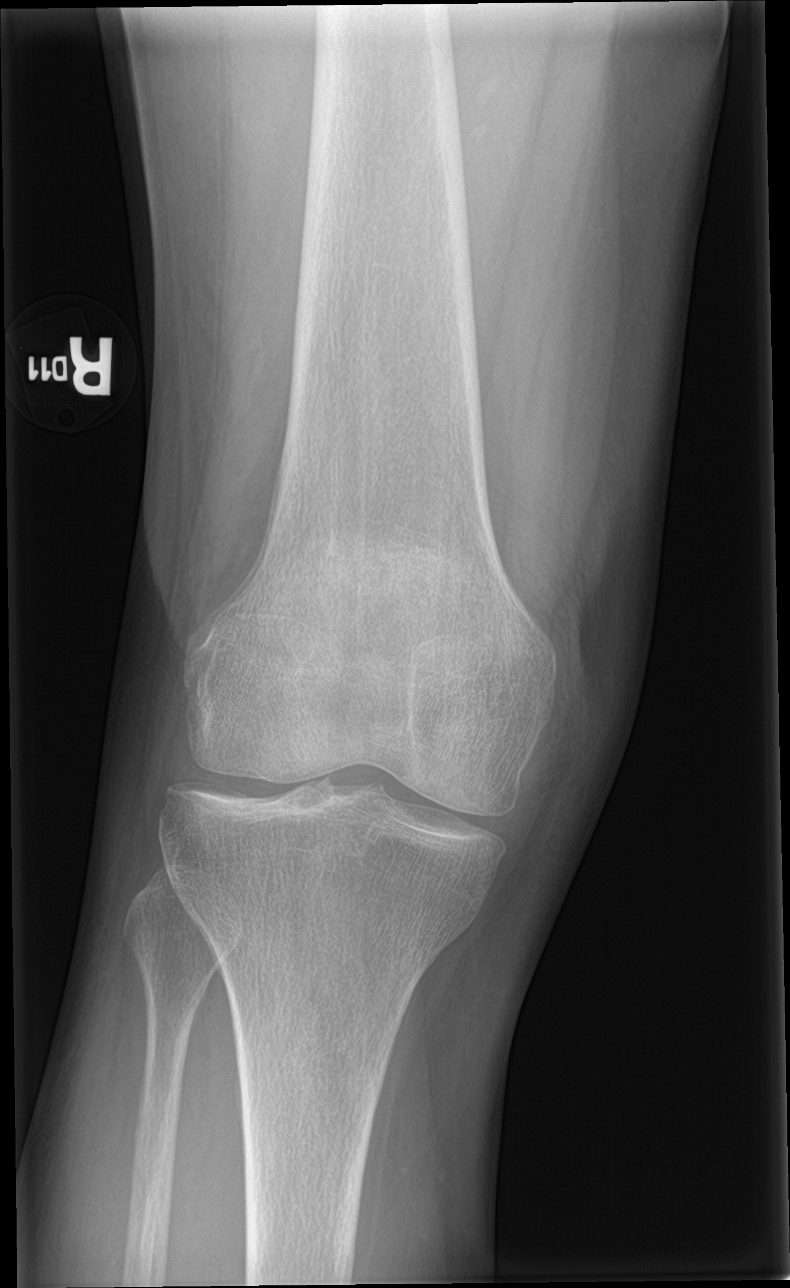

[knee obl (1 of 2)]
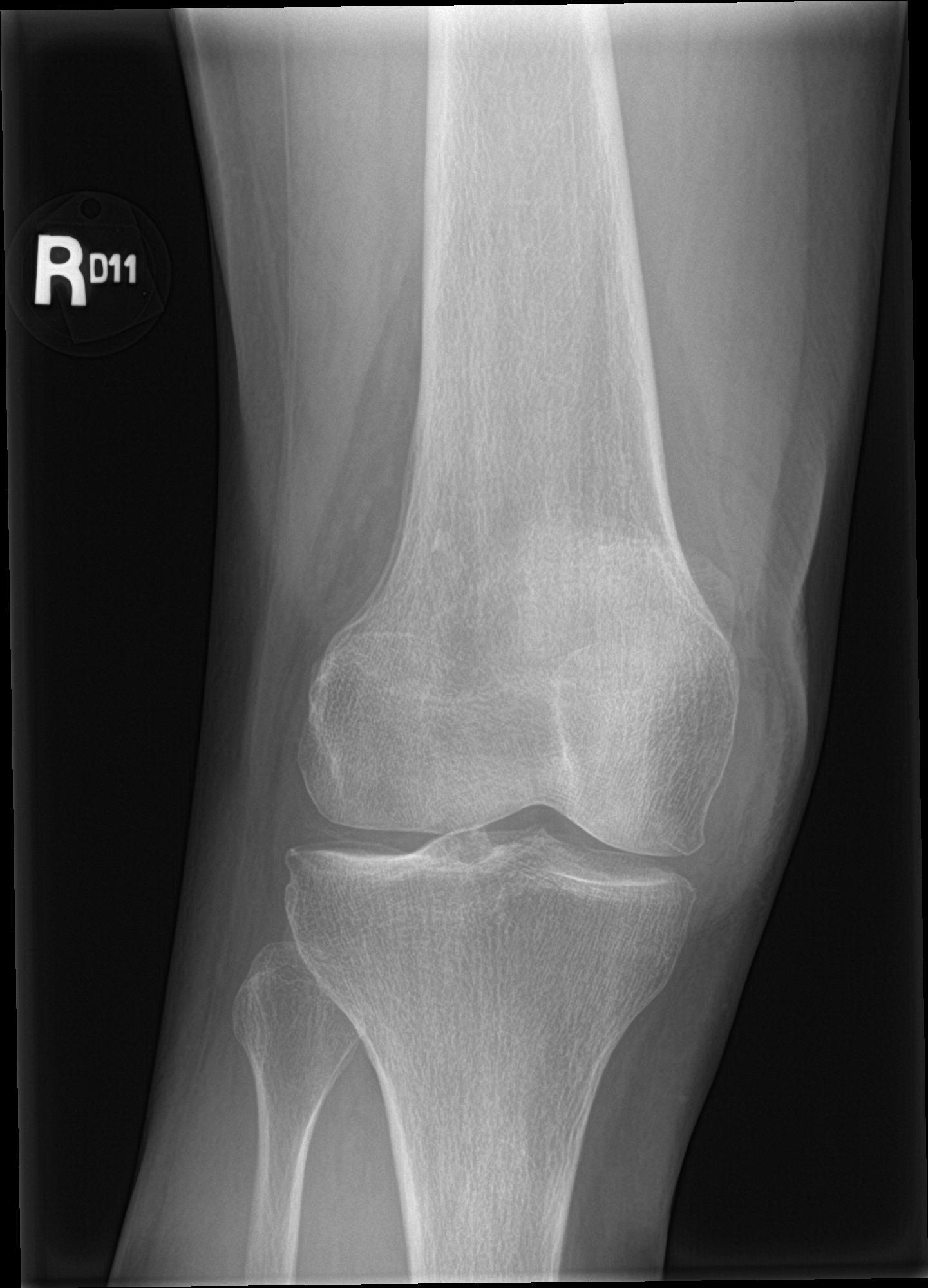

[knee obl (2 of 2)]
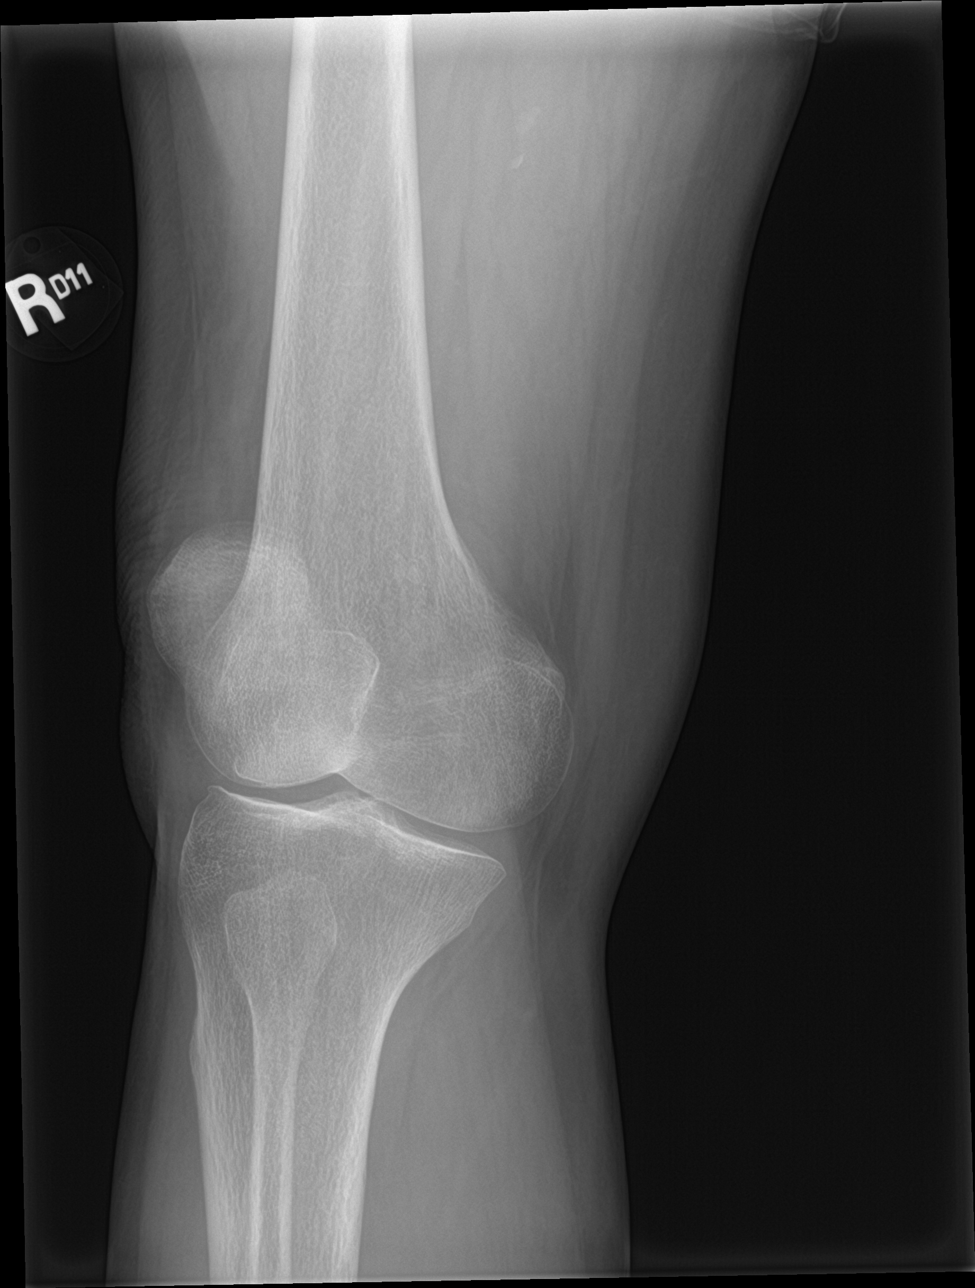

[knee lat]
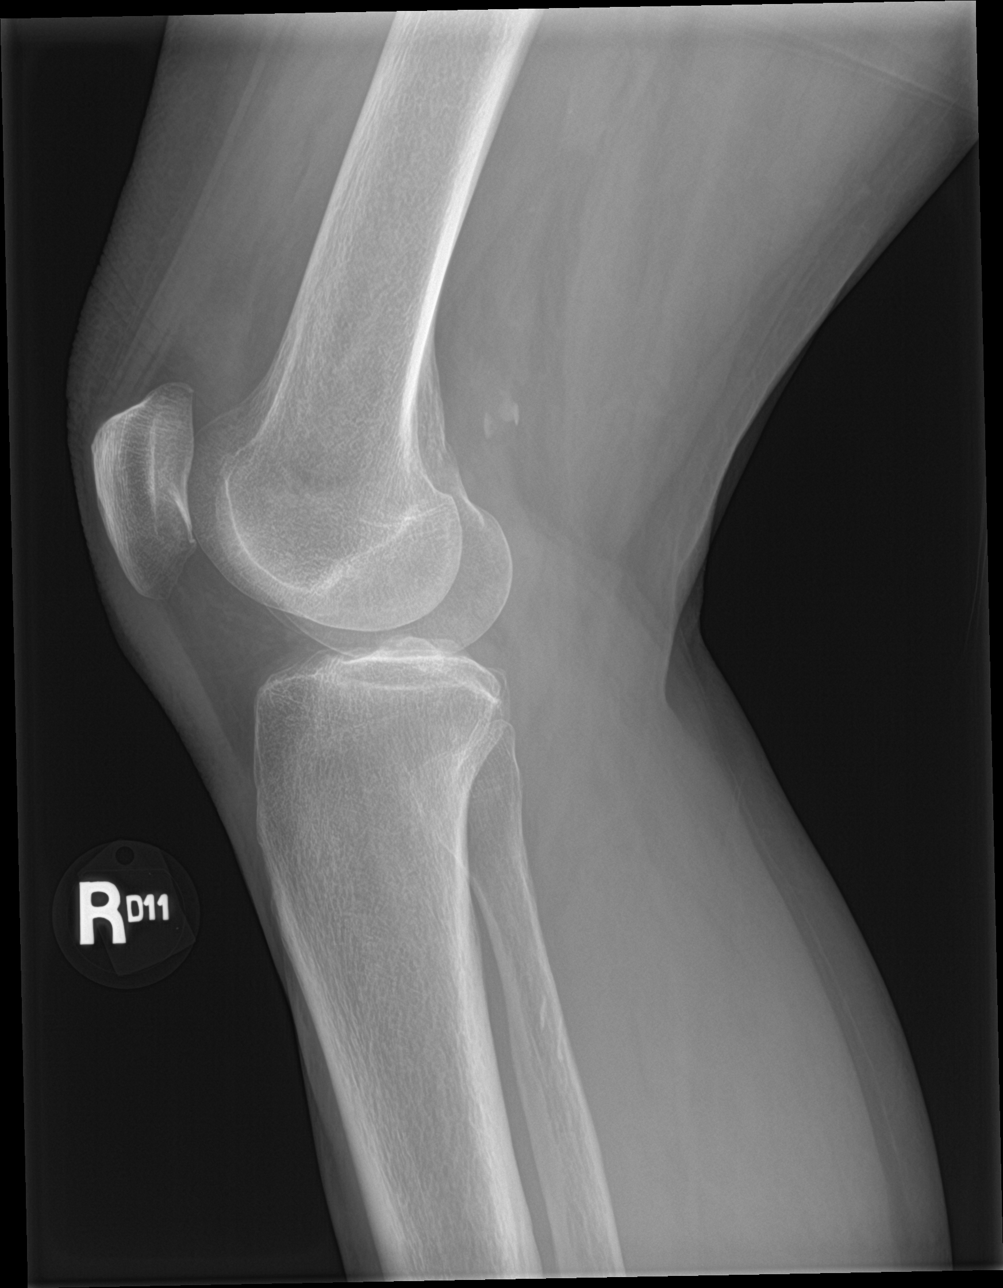

[4 of 4 positions shown; findings below may reference images not displayed]

FINDINGS: Minimal soft tissue swelling anteriorly with mild thickening of the
proximal patellar tendon. Trace effusion. No acute bony abnormality.
Specifically, no fracture, subluxation, or dislocation. Mild
tricompartmental degenerative changes. Vascular calcium in the soft
tissues posteriorly.
IMPRESSION: 1. Minimal soft tissue swelling anteriorly with mild thickening of
the proximal patellar tendon. Could reflect some underlying
tendinopathy.
2. Trace effusion.
3. Mild tricompartmental degenerative changes.
